# Patient Record
Sex: Female | Born: 1965
Health system: Southern US, Community
[De-identification: ages and names within clinical notes are randomized; demographics above are authoritative.]

## PROBLEM LIST (undated history)

## (undated) DIAGNOSIS — F32A Depression, unspecified: Secondary | ICD-10-CM

## (undated) DIAGNOSIS — IMO0001 Reserved for inherently not codable concepts without codable children: Secondary | ICD-10-CM

## (undated) DIAGNOSIS — T7840XA Allergy, unspecified, initial encounter: Secondary | ICD-10-CM

## (undated) DIAGNOSIS — F329 Major depressive disorder, single episode, unspecified: Secondary | ICD-10-CM

## (undated) HISTORY — DX: Depression, unspecified: F32.A

## (undated) HISTORY — PX: MOUTH SURGERY: SHX715

## (undated) HISTORY — DX: Allergy, unspecified, initial encounter: T78.40XA

## (undated) HISTORY — PX: BREAST BIOPSY: SHX20

## (undated) HISTORY — DX: Reserved for inherently not codable concepts without codable children: IMO0001

## (undated) HISTORY — DX: Major depressive disorder, single episode, unspecified: F32.9

---

## 2014-02-17 LAB — HM PAP SMEAR

## 2014-02-22 LAB — HM PAP SMEAR: HM Pap smear: NEGATIVE

## 2015-01-22 ENCOUNTER — Telehealth: Payer: Self-pay | Admitting: *Deleted

## 2015-01-22 NOTE — Telephone Encounter (Signed)
Patient unavailable at time of pre-visit call.   Patient has a voicemail that has not been set up, unable to leave message.

## 2015-01-23 ENCOUNTER — Ambulatory Visit: Payer: Self-pay | Admitting: Internal Medicine

## 2015-06-25 ENCOUNTER — Telehealth: Payer: Self-pay | Admitting: Behavioral Health

## 2015-06-25 NOTE — Telephone Encounter (Signed)
Unable to reach patient at time of Pre-Visit Call. Per recording, the patient's  number has either been changed, disconnected or no longer in service.

## 2015-06-26 ENCOUNTER — Encounter: Payer: Self-pay | Admitting: Internal Medicine

## 2015-06-26 ENCOUNTER — Ambulatory Visit (INDEPENDENT_AMBULATORY_CARE_PROVIDER_SITE_OTHER): Payer: BLUE CROSS/BLUE SHIELD | Admitting: Internal Medicine

## 2015-06-26 VITALS — BP 106/70 | HR 69 | Temp 97.9°F | Ht 71.0 in | Wt 211.2 lb

## 2015-06-26 DIAGNOSIS — F329 Major depressive disorder, single episode, unspecified: Secondary | ICD-10-CM | POA: Diagnosis not present

## 2015-06-26 DIAGNOSIS — F419 Anxiety disorder, unspecified: Secondary | ICD-10-CM | POA: Insufficient documentation

## 2015-06-26 DIAGNOSIS — F32A Depression, unspecified: Secondary | ICD-10-CM

## 2015-06-26 MED ORDER — SERTRALINE HCL 50 MG PO TABS: 50.0000 mg | ORAL_TABLET | Freq: Every day | ORAL | Status: DC

## 2015-06-26 NOTE — Progress Notes (Signed)
Pre visit review using our clinic review tool, if applicable. No additional management support is needed unless otherwise documented below in the visit note. 

## 2015-06-26 NOTE — Progress Notes (Signed)
   Subjective:    Patient ID: Rita Lambert, female    DOB: [birth year], 49 y.o.   MRN: [id number]  DOS:  06/26/2015 Type of visit - description :  New patient to establish, switching PCP Interval history: In general feeling well, long history of depression, on Zoloft for years, symptoms currently well controlled.    Review of Systems  denies chest pain or difficulty breathing No nausea, vomiting, diarrhea Started to increase her exercisea couple of months ago, has lot 10 pounds  Past Medical History  Diagnosis Date  . Allergy   . Depression   . Contraception     condoms    Past Surgical History  Procedure Laterality Date  . Vaginal delivery      2000, 2004, and 2010  . Mouth surgery      teeth removal, freniolectomy    Social History   Social History  . Marital Status: Married    Spouse Name: N/A  . Number of Children: 3  . Years of Education: N/A   Occupational History  . RN, health care communications , working on a PhD    Social History Main Topics  . Smoking status: Never Smoker   . Smokeless tobacco: Not on file  . Alcohol Use: 0.0 oz/week    0 Standard drinks or equivalent per week     Comment: Social  . Drug Use: No  . Sexual Activity: Not on file   Other Topics Concern  . Not on file   Social History Narrative     Family History  Problem Relation Age of Onset  . Breast cancer Other     aunt  . Colon cancer Neg Hx   . CAD Other     GF, uncle  . Diabetes Neg Hx   . Leukemia Mother        Medication List       This list is accurate as of: 06/26/15 11:59 PM.  Always use your most recent med list.               mometasone 50 MCG/ACT nasal spray  Commonly known as:  NASONEX  Place 2 sprays into the nose daily as needed. During Spring     sertraline 50 MG tablet  Commonly known as:  ZOLOFT  Take 1 tablet (50 mg total) by mouth daily.           Objective:   Physical Exam BP 106/70 mmHg  Pulse 69  Temp(Src) 97.9 F (36.6  C) (Oral)  Ht  (1.803 m)  Wt 211 lb 4 oz (95.822 kg)  BMI 29.48 kg/m2  SpO2 97%  LMP 06/19/2015 (Approximate)  General:   Well developed, well nourished . NAD.  HEENT:  Normocephalic . Face symmetric, atraumatic Lungs:  CTA B Normal respiratory effort, no intercostal retractions, no accessory muscle use. Heart: RRR,  no murmur.  No pretibial edema bilaterally  Skin: Not pale. Not jaundice Neurologic:  alert & oriented X3.  Speech normal, gait appropriate for age and unassisted Psych--  Cognition and judgment appear intact.  Cooperative with normal attention span and concentration.  Behavior appropriate. No anxious or depressed appearing.      Assessment & Plan:   depression: Refill Zoloft Doing well  Follow-up 6 months for a physical. Sees gyn

## 2015-10-11 ENCOUNTER — Other Ambulatory Visit: Payer: Self-pay

## 2015-10-11 MED ORDER — SERTRALINE HCL 50 MG PO TABS: 50.0000 mg | ORAL_TABLET | Freq: Every day | ORAL | Status: DC

## 2015-12-27 ENCOUNTER — Encounter: Payer: BLUE CROSS/BLUE SHIELD | Admitting: Internal Medicine

## 2016-04-01 ENCOUNTER — Telehealth: Payer: Self-pay | Admitting: *Deleted

## 2016-04-01 ENCOUNTER — Encounter: Payer: Self-pay | Admitting: *Deleted

## 2016-04-01 NOTE — Telephone Encounter (Signed)
Pre-Visit Call completed with patient and chart updated.   Pre-Visit Info documented in Specialty Comments under Snap[employer]ot.    

## 2016-04-02 ENCOUNTER — Ambulatory Visit (INDEPENDENT_AMBULATORY_CARE_PROVIDER_SITE_OTHER): Payer: 59 | Admitting: Internal Medicine

## 2016-04-02 ENCOUNTER — Encounter: Payer: Self-pay | Admitting: Internal Medicine

## 2016-04-02 VITALS — BP 108/74 | HR 89 | Temp 98.0°F | Ht 71.0 in | Wt 222.2 lb

## 2016-04-02 DIAGNOSIS — Z114 Encounter for screening for human immunodeficiency virus [HIV]: Secondary | ICD-10-CM

## 2016-04-02 DIAGNOSIS — Z0000 Encounter for general adult medical examination without abnormal findings: Secondary | ICD-10-CM | POA: Diagnosis not present

## 2016-04-02 DIAGNOSIS — Z23 Encounter for immunization: Secondary | ICD-10-CM | POA: Diagnosis not present

## 2016-04-02 DIAGNOSIS — Z09 Encounter for follow-up examination after completed treatment for conditions other than malignant neoplasm: Secondary | ICD-10-CM

## 2016-04-02 DIAGNOSIS — M25512 Pain in left shoulder: Secondary | ICD-10-CM

## 2016-04-02 NOTE — Progress Notes (Signed)
Pre visit review using our clinic review tool, if applicable. No additional management support is needed unless otherwise documented below in the visit note. 

## 2016-04-02 NOTE — Progress Notes (Signed)
Subjective:    Patient ID: Rita KirschnerDonna L Lambert, female    DOB: [birth year], 50 y.o.   MRN: [id number]  DOS:  04/02/2016 Type of visit - description : CPX Interval history: Depression well-controlled, has pain at the shoulder. See below    Review of Systems Constitutional: No fever. No chills. No unexplained wt changes. No unusual sweats  HEENT: No dental problems, no ear discharge, no facial swelling, no voice changes. No eye discharge, no eye  redness , no  intolerance to light   Respiratory: No wheezing , no  difficulty breathing. No cough , no mucus production  Cardiovascular: No CP, no leg swelling , no  Palpitations  GI: no nausea, no vomiting, no diarrhea , no  abdominal pain.  No blood in the stools. No dysphagia, no odynophagia    Endocrine: No polyphagia, no polyuria , no polydipsia  GU: No dysuria, gross hematuria, difficulty urinating. No urinary urgency, no frequency.  Musculoskeletal:  1 year history of left shoulder pain, at the deltoid area, only bothers her with certain movements, no neck pain.  Skin: No change in the color of the skin, palor , no  Rash  Allergic, immunologic: No environmental allergies , no  food allergies  Neurological: No dizziness no  syncope. No headaches. No diplopia, no slurred, no slurred speech, no motor deficits, no facial  Numbness  Hematological: No enlarged lymph nodes, no easy bruising , no unusual bleedings  Psychiatry: No suicidal ideas, no hallucinations, no beavior problems, no confusion.  No unusual/severe anxiety, no depression   Past Medical History  Diagnosis Date  . Allergy   . Depression   . Contraception     condoms    Past Surgical History  Procedure Laterality Date  . Vaginal delivery      2000, 2004, and 2010  . Mouth surgery      teeth removal, freniolectomy    Social History   Social History  . Marital Status: Married    Spouse Name: N/A  . Number of Children: 3  . Years of Education: N/A    Occupational History  . RN, health care communications ,  has a PhD    Social History Main Topics  . Smoking status: Never Smoker   . Smokeless tobacco: Not on file  . Alcohol Use: 0.0 oz/week    0 Standard drinks or equivalent per week     Comment: Social  . Drug Use: No  . Sexual Activity: Not on file   Other Topics Concern  . Not on file   Social History Narrative   Has a PhD   3 children Born 2000, 2004, 2010     Family History  Problem Relation Age of Onset  . Breast cancer Other     aunt  . Colon cancer Neg Hx   . CAD Other     GF, uncle  . Diabetes Neg Hx   . Leukemia Mother        Medication List       This list is accurate as of: 04/02/16 11:59 PM.  Always use your most recent med list.               ibuprofen 200 MG tablet  Commonly known as:  ADVIL,MOTRIN  Take 400 mg by mouth as needed for mild pain or moderate pain.     mometasone 50 MCG/ACT nasal spray  Commonly known as:  NASONEX  Place 2 sprays into the nose daily as needed.  During Spring     sertraline 50 MG tablet  Commonly known as:  ZOLOFT  Take 1 tablet (50 mg total) by mouth daily.           Objective:   Physical Exam BP 108/74 mmHg  Pulse 89  Temp(Src) 98 F (36.7 C) (Oral)  Ht  (1.803 m)  Wt 222 lb 4 oz (100.812 kg)  BMI 31.01 kg/m2  SpO2 98%  LMP 03/11/2016 (Approximate) General:   Well developed, well nourished . NAD.  Neck: No  thyromegaly  HEENT:  Normocephalic . Face symmetric, atraumatic Lungs:  CTA B Normal respiratory effort, no intercostal retractions, no accessory muscle use. Heart: RRR,  no murmur.  No pretibial edema bilaterally  Abdomen:  Not distended, soft, non-tender. No rebound or rigidity.   Skin: Exposed areas without rash. Not pale. Not jaundice MSK R shoulder wnl  L shoulder, no deformities, ROM decreased particularly w/ arm elevation Neurologic:  alert & oriented X3.  Speech normal, gait appropriate for age and  unassisted Strength symmetric and appropriate for age.  Psych: Cognition and judgment appear intact.  Cooperative with normal attention span and concentration.  Behavior appropriate. No anxious or depressed appearing.     Assessment & Plan:   Assessment Depression Allergies  Contraception: Condoms  Plan: Depression-- controlled cont zoloft Shoulder pain: refer to sports med  RTC 1 year

## 2016-04-02 NOTE — Patient Instructions (Signed)
   GO TO THE FRONT DESK Schedule your next appointment for a    Schedule labs to be done within a week, fasting

## 2016-04-02 NOTE — Assessment & Plan Note (Addendum)
Td  04-02-16 CCS: never had a cscope Female Care Dr Allena KatzPatel >> PAP: 2015, MMG ~ 2015 plans to call and set up appointments Diet-- doing well w/ calorie counting Encourage gradual exercise Labs: CMP CBC, FLP, TSH, HIV EKG- for baseline, NSR RTC  1 year

## 2016-04-03 ENCOUNTER — Other Ambulatory Visit (INDEPENDENT_AMBULATORY_CARE_PROVIDER_SITE_OTHER): Payer: 59

## 2016-04-03 DIAGNOSIS — Z0000 Encounter for general adult medical examination without abnormal findings: Secondary | ICD-10-CM | POA: Diagnosis not present

## 2016-04-03 DIAGNOSIS — Z114 Encounter for screening for human immunodeficiency virus [HIV]: Secondary | ICD-10-CM

## 2016-04-03 LAB — CBC WITH DIFFERENTIAL/PLATELET
Basophils Absolute: 0 10*3/uL (ref 0.0–0.1)
Basophils Relative: 0.4 % (ref 0.0–3.0)
EOS PCT: 3 % (ref 0.0–5.0)
Eosinophils Absolute: 0.2 10*3/uL (ref 0.0–0.7)
HEMATOCRIT: 37.5 % (ref 36.0–46.0)
HEMOGLOBIN: 12.2 g/dL (ref 12.0–15.0)
LYMPHS ABS: 1.5 10*3/uL (ref 0.7–4.0)
LYMPHS PCT: 18.9 % (ref 12.0–46.0)
MCHC: 32.5 g/dL (ref 30.0–36.0)
MCV: 77.5 fl — AB (ref 78.0–100.0)
MONOS PCT: 5.1 % (ref 3.0–12.0)
Monocytes Absolute: 0.4 10*3/uL (ref 0.1–1.0)
Neutro Abs: 5.7 10*3/uL (ref 1.4–7.7)
Neutrophils Relative %: 72.6 % (ref 43.0–77.0)
Platelets: 300 10*3/uL (ref 150.0–400.0)
RBC: 4.83 Mil/uL (ref 3.87–5.11)
RDW: 15.8 % — ABNORMAL HIGH (ref 11.5–15.5)
WBC: 7.9 10*3/uL (ref 4.0–10.5)

## 2016-04-03 LAB — COMPREHENSIVE METABOLIC PANEL
ALBUMIN: 4.3 g/dL (ref 3.5–5.2)
ALK PHOS: 63 U/L (ref 39–117)
ALT: 8 U/L (ref 0–35)
AST: 13 U/L (ref 0–37)
BILIRUBIN TOTAL: 0.5 mg/dL (ref 0.2–1.2)
BUN: 15 mg/dL (ref 6–23)
CALCIUM: 9.1 mg/dL (ref 8.4–10.5)
CO2: 26 mEq/L (ref 19–32)
CREATININE: 0.96 mg/dL (ref 0.40–1.20)
Chloride: 107 mEq/L (ref 96–112)
GFR: 65.75 mL/min (ref >60.00)
Glucose, Bld: 102 mg/dL — ABNORMAL HIGH (ref 70–99)
Potassium: 3.8 mEq/L (ref 3.5–5.1)
Sodium: 139 mEq/L (ref 135–145)
TOTAL PROTEIN: 6.7 g/dL (ref 6.0–8.3)

## 2016-04-03 LAB — HIV ANTIBODY (ROUTINE TESTING W REFLEX): HIV 1&2 Ab, 4th Generation: NONREACTIVE

## 2016-04-03 LAB — LIPID PANEL
CHOLESTEROL: 180 mg/dL (ref 0–200)
HDL: 48.9 mg/dL (ref >39.00)
LDL Cholesterol: 122 mg/dL — ABNORMAL HIGH (ref 0–99)
NONHDL: 130.98
TRIGLYCERIDES: 46 mg/dL (ref 0.0–149.0)
Total CHOL/HDL Ratio: 4
VLDL: 9.2 mg/dL (ref 0.0–40.0)

## 2016-04-03 LAB — TSH: TSH: 1.58 u[IU]/mL (ref 0.35–4.50)

## 2016-04-04 ENCOUNTER — Ambulatory Visit: Payer: 59 | Admitting: Family Medicine

## 2016-04-09 ENCOUNTER — Ambulatory Visit (INDEPENDENT_AMBULATORY_CARE_PROVIDER_SITE_OTHER): Payer: 59 | Admitting: Family Medicine

## 2016-04-09 ENCOUNTER — Encounter: Payer: Self-pay | Admitting: Family Medicine

## 2016-04-09 VITALS — BP 110/71 | HR 83 | Ht 71.0 in | Wt 220.0 lb

## 2016-04-09 DIAGNOSIS — Z09 Encounter for follow-up examination after completed treatment for conditions other than malignant neoplasm: Secondary | ICD-10-CM | POA: Insufficient documentation

## 2016-04-09 DIAGNOSIS — M25512 Pain in left shoulder: Secondary | ICD-10-CM

## 2016-04-09 MED ORDER — MELOXICAM 15 MG PO TABS: 15.0000 mg | ORAL_TABLET | Freq: Every day | ORAL | Status: DC

## 2016-04-09 NOTE — Patient Instructions (Signed)
You have rotator cuff impingement after the initial rotator cuff strain Try to avoid painful activities (overhead activities, lifting with extended arm) as much as possible. Meloxicam 15mg  daily with food for pain and inflammation. Can take tylenol in addition to this. Subacromial injection may be beneficial to help with pain and to decrease inflammation. Start physical therapy with transition to home exercise program. Do home exercise program with theraband and scapular stabilization exercises daily - these are very important for long term relief even if an injection was given.  3 sets of 10 once a day.  Add the external rotation stretching I showed you too. If not improving at follow-up we will consider imaging, injection, and/or nitro patches. Follow up with me in 6 weeks.

## 2016-04-10 DIAGNOSIS — M25512 Pain in left shoulder: Secondary | ICD-10-CM | POA: Insufficient documentation

## 2016-04-10 NOTE — Assessment & Plan Note (Signed)
consistent with overuse strain of supraspinatus that has become impingement since then.  Start with meloxicam, physical therapy and home exercises.  Consider imaging, subacromial injection, nitro patches if not improving.  F/u in 6 weeks.

## 2016-04-10 NOTE — Progress Notes (Signed)
PCP and consultation requested by: Willow OraJose Paz, MD  Subjective:   HPI: Patient is a 50 y.o. female here for left shoulder pain.  Patient reports she recalls hanging clothes a year ago in the closet and started to get worsening lateral left shoulder pain. Has continued to have problems especially reaching out to side, lying on the left side since then. Pain is 0/10 at rest, up to 8/10 and sharp. No prior issues with this shoulder. Difficulty with externally rotating. No skin changes, numbness.  Past Medical History  Diagnosis Date  . Allergy   . Depression   . Contraception     condoms    Current Outpatient Prescriptions on File Prior to Visit  Medication Sig Dispense Refill  . ibuprofen (ADVIL,MOTRIN) 200 MG tablet Take 400 mg by mouth as needed for mild pain or moderate pain.    . mometasone (NASONEX) 50 MCG/ACT nasal spray Place 2 sprays into the nose daily as needed. During Spring    . sertraline (ZOLOFT) 50 MG tablet Take 1 tablet (50 mg total) by mouth daily. 90 tablet 1   No current facility-administered medications on file prior to visit.    Past Surgical History  Procedure Laterality Date  . Vaginal delivery      2000, 2004, and 2010  . Mouth surgery      teeth removal, freniolectomy    No Known Allergies  Social History   Social History  . Marital Status: Married    Spouse Name: N/A  . Number of Children: 3  . Years of Education: N/A   Occupational History  . RN, health care communications ,  has a PhD    Social History Main Topics  . Smoking status: Never Smoker   . Smokeless tobacco: Not on file  . Alcohol Use: 0.0 oz/week    0 Standard drinks or equivalent per week     Comment: Social  . Drug Use: No  . Sexual Activity: Not on file   Other Topics Concern  . Not on file   Social History Narrative   Has a PhD   3 children Born 2000, 2004, 2010    Family History  Problem Relation Age of Onset  . Breast cancer Other     aunt  . Colon  cancer Neg Hx   . CAD Other     GF, uncle  . Diabetes Neg Hx   . Leukemia Mother     BP 110/71 mmHg  Pulse 83  Ht 5\' 11"  (1.803 m)  Wt 220 lb (99.791 kg)  BMI 30.70 kg/m2  LMP 03/11/2016 (Approximate)  Review of Systems: See HPI above.    Objective:  Physical Exam:  Gen: NAD, comfortable in exam room  Left shoulder: No swelling, ecchymoses.  No gross deformity. No TTP. Lacks 15 degrees external rotation.  Otherwise FROM with painful arc. Negative Hawkins, Neers. Negative Yergasons. Strength 5/5 with empty can and resisted internal/external rotation.  Mild pain empty can Negative apprehension. 1+ sulcus but same on right side. NV intact distally.  Right shoulder: FROM without pain.    Assessment & Plan:  1. Left shoulder pain - consistent with overuse strain of supraspinatus that has become impingement since then.  Start with meloxicam, physical therapy and home exercises.  Consider imaging, subacromial injection, nitro patches if not improving.  F/u in 6 weeks.

## 2016-04-22 ENCOUNTER — Ambulatory Visit: Payer: 59 | Attending: Family Medicine | Admitting: Physical Therapy

## 2016-04-22 DIAGNOSIS — R293 Abnormal posture: Secondary | ICD-10-CM | POA: Insufficient documentation

## 2016-04-22 DIAGNOSIS — M25512 Pain in left shoulder: Secondary | ICD-10-CM | POA: Diagnosis present

## 2016-04-22 DIAGNOSIS — M25612 Stiffness of left shoulder, not elsewhere classified: Secondary | ICD-10-CM

## 2016-04-22 NOTE — Therapy (Signed)
[facility] [address]  [address] [place], [place], [address] Phone: [phone]   Fax:  [phone]  Physical Therapy Evaluation  Patient Details  Name: Rita Lambert MRN: [id number] Date of Birth: 1966-09-15 Referring Provider: Dr. Norton Blizzard  Encounter Date: 04/22/2016      PT End of Session - 04/22/16 1530    Visit Number 1   Number of Visits 12   Date for PT Re-Evaluation 06/03/16   PT Start Time 1345   PT Stop Time 1425   PT Time Calculation (min) 40 min   Activity Tolerance Patient tolerated treatment well   Behavior During Therapy [facility] for tasks assessed/performed      Past Medical History  Diagnosis Date  . Allergy   . Depression   . Contraception     condoms    Past Surgical History  Procedure Laterality Date  . Vaginal delivery      2000, 2004, and 2010  . Mouth surgery      teeth removal, freniolectomy    There were no vitals filed for this visit.       Subjective Assessment - 04/22/16 1348    Subjective Pt is a 50 y/o female that presents to OPPT for L shoulder pain.  Pt reports symptoms began 9-10 months ago with repetitive lifting and putting clothes away overhead.  Pt reports she has to modify activities due to pain and limited motion.     Pertinent History depression   Limitations Lifting;House hold activities;Other (comment)  sudden movements   Patient Stated Goals improve motion, decreased pain with reaching   Currently in Pain? Yes   Pain Score 0-No pain  up to 8/10   Pain Location Shoulder   Pain Orientation Left   Pain Descriptors / Indicators Stabbing   Pain Type Chronic pain   Pain Onset More than a month ago   Pain Frequency Intermittent   Aggravating Factors  reaching, sudden movements; overhead activity   Pain Relieving Factors avoiding motions            [facility] PT Assessment - 04/22/16 1353    Assessment   Medical Diagnosis L shoulder pain/rotator cuff  impingement   Referring Provider Dr. Norton Blizzard   Onset Date/Surgical Date --  9-10 months ago   Hand Dominance Right   Prior Therapy none   Precautions   Precautions None   Restrictions   Weight Bearing Restrictions No   Balance Screen   Has the patient fallen in the past 6 months Yes   How many times? 1   Has the patient had a decrease in activity level because of a fear of falling?  No   Is the patient reluctant to leave their home because of a fear of falling?  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    Leisure has 3 children; travel, shopping, cross stitch   Observation/Other Assessments   Focus on Therapeutic Outcomes (FOTO)  56 (44% limited; predicted 31% limited)   Posture/Postural Control   Posture/Postural Control Postural limitations   Postural Limitations Rounded Shoulders;Forward head;Increased thoracic kyphosis   AROM   AROM Assessment Site Shoulder   Right Shoulder Flexion 154 Degrees   Right Shoulder ABduction 180 Degrees   Right Shoulder Internal Rotation 98 Degrees   Right Shoulder External Rotation 80 Degrees   Left Shoulder  Flexion 135 Degrees   Left Shoulder ABduction 100 Degrees   Left Shoulder Internal Rotation 90 Degrees   Left Shoulder External Rotation 45 Degrees   PROM   PROM Assessment Site Shoulder   Left Shoulder Flexion 140 Degrees   Left Shoulder ABduction 109 Degrees   Left Shoulder External Rotation 55 Degrees   Strength   Strength Assessment Site Shoulder   Left Shoulder Flexion 3-/5   Left Shoulder ABduction 3-/5   Left Shoulder External Rotation 3-/5   Palpation   Palpation comment tenderness along supraspinatus tendon;mod pain with A/P and inf mobs   Special Tests    Special Tests Rotator Cuff Impingement   Rotator Cuff Impingment tests Leanord AsalHawkins- Kennedy test   Hawkins-Kennedy test   Findings Negative   Side Left                    [facility] Adult PT Treatment/Exercise - 04/22/16 1353    Exercises   Exercises Shoulder   Shoulder Exercises: Supine   External Rotation AAROM;10 reps;Left   External Rotation Limitations with cane   Flexion AAROM;10 reps   Flexion Limitations with cane   Shoulder Exercises: Seated   Retraction Both;10 reps                PT Education - 04/22/16 1529    Education provided Yes   Education Details HEP, clinical findings, POC, goals of care   Person(s) Educated Patient   Methods Explanation;Demonstration;Handout   Comprehension Verbalized understanding;Returned demonstration             PT Long Term Goals - 04/22/16 1536    PT LONG TERM GOAL #1   Title independent with HEP (06/03/16)   Time 6   Period Weeks   Status New   PT LONG TERM GOAL #2   Title verbalize understanding of posture to decrease risk of reinjury (06/03/16)   Time 6   Period Weeks   Status New   PT LONG TERM GOAL #3   Title improve L shoulder ROM to WNL for improved ability to perform activities without modification (06/03/16)   Time 6   Period Weeks   Status New   PT LONG TERM GOAL #4   Title improve L shoulder strength to at least 4/5 for improved function    Time 6   Period Weeks   Status New               Plan - 04/22/16 1531    Clinical Impression Statement Pt is a 50 y/o female who presents to OPPT for low complexity evaluation of L shoulder pain.  Pt demonstrates ROM limitations, decreased strength, and postural dysfunction with pain and decreased functional mobility.  Pt will benefit from PT to maximize function and decrease pain.   Rehab Potential Good   PT Frequency 2x / week   PT Duration 6 weeks   PT Treatment/Interventions ADLs/Self Care Home Management;Cryotherapy;Electrical Stimulation;Iontophoresis 4mg /ml Dexamethasone;Moist Heat;Ultrasound;Patient/family education;Therapeutic exercise;Therapeutic activities;Functional mobility  training;Manual techniques;Passive range of motion;Dry needling;Taping;Vasopneumatic Device   PT Next Visit Plan review HEP; postural education; manual for ROM and gentle strengthening exercises   Consulted and Agree with Plan of Care Patient      Patient will benefit from skilled therapeutic intervention in order to improve the following deficits and impairments:  Postural dysfunction, Decreased strength, Pain, Hypermobility, Impaired UE functional use, Decreased range of motion  Visit Diagnosis: Pain in left shoulder - Plan: PT plan of care cert/re-cert  Stiffness  of left shoulder, not elsewhere classified - Plan: PT plan of care cert/re-cert  Abnormal posture - Plan: PT plan of care cert/re-cert     Problem List Patient Active Problem List   Diagnosis Date Noted  . Left shoulder pain 04/10/2016  . PCP NOTES >>>>>>>>>>>>>>>> 04/09/2016  . Annual physical exam 04/02/2016  . Depression 06/26/2015   Clarita Crane, PT, DPT 04/22/2016 3:40 PM  [facility] [address]  [address] [place], [place], [address] Phone: [phone]   Fax:  [phone]  Name: Rita Lambert MRN: [id number] Date of Birth: [birth year]

## 2016-04-22 NOTE — Patient Instructions (Signed)
SHOULDER: Flexion - Supine (Cane)    Hold cane in both hands. Raise arms up overhead. Do not allow back to arch. Hold _3-5__ seconds. _10__ reps per set, __1-2_ sets per day.   Copyright  VHI. All rights reserved.    SHOULDER: External Rotation - Supine (Cane)    Hold cane with both hands. Rotate arm away from body. Keep elbow on floor and next to body. _10__ reps per set, _1-2__ sets per day. Add towel to keep elbow at side.  Copyright  VHI. All rights reserved.   SCAPULA: Retraction    Hold cane with both hands. Pinch shoulder blades together. Do not shrug shoulders. Hold _3-5__ seconds.  _10__ reps per set, _1-2__ sets per day.  Copyright  VHI. All rights reserved.

## 2016-04-24 ENCOUNTER — Ambulatory Visit: Payer: 59

## 2016-04-24 DIAGNOSIS — M25512 Pain in left shoulder: Secondary | ICD-10-CM

## 2016-04-24 DIAGNOSIS — M25612 Stiffness of left shoulder, not elsewhere classified: Secondary | ICD-10-CM

## 2016-04-24 DIAGNOSIS — R293 Abnormal posture: Secondary | ICD-10-CM

## 2016-04-24 NOTE — Therapy (Signed)
[facility] [address]  [address] [place], [place], [address] Phone: [phone]   Fax:  [phone]  Physical Therapy Treatment  Patient Details  Name: KEMYA SHED MRN: [id number] Date of Birth: July 09, 1966 Referring Provider: Dr. Norton Blizzard  Encounter Date: 04/24/2016      PT End of Session - 04/24/16 1718    Visit Number 2   Number of Visits 12   Date for PT Re-Evaluation 06/03/16   PT Start Time 1624   PT Stop Time 1705   PT Time Calculation (min) 41 min   Activity Tolerance Patient tolerated treatment well   Behavior During Therapy [facility] for tasks assessed/performed      Past Medical History  Diagnosis Date  . Allergy   . Depression   . Contraception     condoms    Past Surgical History  Procedure Laterality Date  . Vaginal delivery      2000, 2004, and 2010  . Mouth surgery      teeth removal, freniolectomy    There were no vitals filed for this visit.      Subjective Assessment - 04/24/16 1733    Subjective Pt. reports she has performed HEP yesterday and is pain free currently.     Patient Stated Goals improve motion, decreased pain with reaching   Currently in Pain? No/denies   Pain Score 0-No pain   Multiple Pain Sites No     Today's treatment:  Therex: ER/IR with yellow TB in door x 15 reps: with towel squeeze  Standing scapular retraction with green TB x 15 reps  Supine L shoulder protraction 2 x 15 reps; 2nd set with 2# dumbbell  Manual: L UT TPR x 3 min; along medial scapular border  L UT STM x 2 min  HEP review: L shoulder AAROM flexion with wand x 10 reps  L shoulder AAROM ER with wand x 10 reps  Seated scapular retraction x 10 reps   Education:  Seated postural education L shoulder impingement visual on computer and with model with explanation of importance of rotator cuff strength and functional role of these muscles        PT Education - 04/24/16 1717    Education provided Yes   Education Details IR/ER with yellow band, retraction with green TB    Person(s) Educated Patient   Methods Explanation;Handout;Verbal cues;Tactile cues   Comprehension Verbalized understanding;Returned demonstration;Need further instruction             PT Long Term Goals - 04/24/16 1734    PT LONG TERM GOAL #1   Title independent with HEP (06/03/16)   Time 6   Period Weeks   Status On-going   PT LONG TERM GOAL #2   Title verbalize understanding of posture to decrease risk of reinjury (06/03/16)   Time 6   Period Weeks   Status On-going   PT LONG TERM GOAL #3   Title improve L shoulder ROM to WNL for improved ability to perform activities without modification (06/03/16)   Time 6   Period Weeks   Status On-going   PT LONG TERM GOAL #4   Title improve L shoulder strength to at least 4/5 for improved function    Time 6   Period Weeks   Status On-going               Plan - 04/24/16 1734    Clinical Impression Statement Pt. reports she has performed  HEP yesterday and is pain free currently.  Pt. tolerated all L shoulder AROM and conservative strengthening today however required occasional VC's to avoid painful arc; scapular strengthening activity with TB resistance introduced today with pt. pain free.  IR/ER with yellow TB and scapular retraction with green TB added to HEP today; pt. would benefit from further review of these in coming treatments.     PT Treatment/Interventions ADLs/Self Care Home Management;Cryotherapy;Electrical Stimulation;Iontophoresis 4mg /ml Dexamethasone;Moist Heat;Ultrasound;Patient/family education;Therapeutic exercise;Therapeutic activities;Functional mobility training;Manual techniques;Passive range of motion;Dry needling;Taping;Vasopneumatic Device   PT Next Visit Plan Review HEP activities added last treatment; postural education; manual for ROM and gentle strengthening exercises      Patient will benefit from skilled  therapeutic intervention in order to improve the following deficits and impairments:  Postural dysfunction, Decreased strength, Pain, Hypermobility, Impaired UE functional use, Decreased range of motion  Visit Diagnosis: Pain in left shoulder  Stiffness of left shoulder, not elsewhere classified  Abnormal posture     Problem List Patient Active Problem List   Diagnosis Date Noted  . Left shoulder pain 04/10/2016  . PCP NOTES >>>>>>>>>>>>>>>> 04/09/2016  . Annual physical exam 04/02/2016  . Depression 06/26/2015    Kermit BaloMicah Deona Novitski, PTA 04/25/2016, 11:30 AM  [facility] [address]  [address] [place], [place], [address] Phone: [phone]   Fax:  [phone]  Name: Elmon KirschnerDonna L Fitzgibbons MRN: [id number] Date of Birth: 1966-10-21

## 2016-05-01 ENCOUNTER — Ambulatory Visit: Payer: 59

## 2016-05-01 DIAGNOSIS — M25512 Pain in left shoulder: Secondary | ICD-10-CM | POA: Diagnosis not present

## 2016-05-01 DIAGNOSIS — M25612 Stiffness of left shoulder, not elsewhere classified: Secondary | ICD-10-CM

## 2016-05-01 DIAGNOSIS — R293 Abnormal posture: Secondary | ICD-10-CM

## 2016-05-01 NOTE — Therapy (Signed)
[facility] [address]  [address] [place], [place], [address] Phone: [phone]   Fax:  [phone]  Physical Therapy Treatment  Patient Details  Name: Rita KirschnerDonna L Lambert MRN: [id number] Date of Birth: Apr 07, 1966 Referring Provider: Dr. Norton BlizzardShane Hudnall  Encounter Date: 05/01/2016      PT End of Session - 05/01/16 1729    Visit Number 3   Number of Visits 12   Date for PT Re-Evaluation 06/03/16   PT Start Time 1537   PT Stop Time 1618   PT Time Calculation (min) 41 min   Activity Tolerance Patient tolerated treatment well   Behavior [facility] for tasks assessed/performed      Past Medical History  Diagnosis Date  . Allergy   . Depression   . Contraception     condoms    Past Surgical History  Procedure Laterality Date  . Vaginal delivery      2000, 2004, and 2010  . Mouth surgery      teeth removal, freniolectomy    There were no vitals filed for this visit.      Subjective Assessment - 05/01/16 1541    Subjective Pt. reports she is pain free today and has been performing the HEP "some days".     Patient Stated Goals improve motion, decreased pain with reaching   Currently in Pain? No/denies   Pain Score 0-No pain   Multiple Pain Sites No       Today's treatment:  Therex:  UBE: 1.5 level, 2 min forward/2 back Supine L shoulder abduction stretch with wand x 15 reps ER/IR with yellow TB in door x 15 reps: with towel squeeze  Supine L shoulder protraction with 3# x 15 reps Supine B shoulder with wand and 3# cuff x 10 reps Standing scapular retraction with blue TB x 15 reps  Standng scapular retraction with extension with blue TB x 10 reps  BATCA low row machine 25# 2 x 15 reps  HEP review (verbally reviewed these): L shoulder AAROM flexion with wand  L shoulder AAROM ER with wand  Seated scapular retraction    Education:  Standing postural education          PT Long Term  Goals - 04/24/16 1734    PT LONG TERM GOAL #1   Title independent with HEP (06/03/16)   Time 6   Period Weeks   Status On-going   PT LONG TERM GOAL #2   Title verbalize understanding of posture to decrease risk of reinjury (06/03/16)   Time 6   Period Weeks   Status On-going   PT LONG TERM GOAL #3   Title improve L shoulder ROM to WNL for improved ability to perform activities without modification (06/03/16)   Time 6   Period Weeks   Status On-going   PT LONG TERM GOAL #4   Title improve L shoulder strength to at least 4/5 for improved function    Time 6   Period Weeks   Status On-going               Plan - 05/01/16 1729    Clinical Impression Statement Pt. reports she is pain free today and has been performing the HEP "some days".  Pt. tolerated continued yellow TB resisted L shoulder ER/IR, and advancement of scapular strengthening activity well today; proper standing posture reviewed with pt. today with frequent VC's throughout scapular retraction activities.  Pt. would benefit from further  posture education and scapular strengthening with advancement of L shoulder strengthening activity per pt. tolerance.  Pt. pain free following treatment today.     PT Treatment/Interventions ADLs/Self Care Home Management;Cryotherapy;Electrical Stimulation;Iontophoresis 4mg /ml Dexamethasone;Moist Heat;Ultrasound;Patient/family education;Therapeutic exercise;Therapeutic activities;Functional mobility training;Manual techniques;Passive range of motion;Dry needling;Taping;Vasopneumatic Device   PT Next Visit Plan Postural education; manual for ROM and gentle strengthening exercises      Patient will benefit from skilled therapeutic intervention in order to improve the following deficits and impairments:  Postural dysfunction, Decreased strength, Pain, Hypermobility, Impaired UE functional use, Decreased range of motion  Visit Diagnosis: Pain in left shoulder  Stiffness of left shoulder, not  elsewhere classified  Abnormal posture     Problem List Patient Active Problem List   Diagnosis Date Noted  . Left shoulder pain 04/10/2016  . PCP NOTES >>>>>>>>>>>>>>>> 04/09/2016  . Annual physical exam 04/02/2016  . Depression 06/26/2015    Kermit BaloMicah Shritha Bresee, PTA 05/01/2016, 5:34 PM  [facility] [address]  [address] [place], [place], [address] Phone: [phone]   Fax:  [phone]  Name: Rita KirschnerDonna L Lambert MRN: [id number] Date of Birth: [birth year]

## 2016-05-09 ENCOUNTER — Ambulatory Visit: Payer: 59

## 2016-05-09 DIAGNOSIS — M25612 Stiffness of left shoulder, not elsewhere classified: Secondary | ICD-10-CM

## 2016-05-09 DIAGNOSIS — M25512 Pain in left shoulder: Secondary | ICD-10-CM | POA: Diagnosis not present

## 2016-05-09 DIAGNOSIS — R293 Abnormal posture: Secondary | ICD-10-CM

## 2016-05-09 NOTE — Therapy (Signed)
[facility] [address]  [place], [place], [address] Phone: [phone]   Fax:  [phone]  Physical Therapy Treatment  Patient Details  Name: Rita Lambert MRN: [id number] Date of Birth: 1966-06-09 Referring Provider: Dr. Karlton Lemon  Encounter Date: 05/09/2016      PT End of Session - 05/09/16 0857    Visit Number 4   Number of Visits 12   Date for PT Re-Evaluation 06/03/16   PT Start Time 2637  Pt. arrived late to therapy thus started treatment late.     PT Stop Time 0932   PT Time Calculation (min) 40 min   Activity Tolerance Patient tolerated treatment well   Behavior During Therapy [facility] for tasks assessed/performed      Past Medical History  Diagnosis Date  . Allergy   . Depression   . Contraception     condoms    Past Surgical History  Procedure Laterality Date  . Vaginal delivery      2000, 2004, and 2010  . Mouth surgery      teeth removal, freniolectomy    There were no vitals filed for this visit.      Subjective Assessment - 05/09/16 0936    Subjective Pt. to therapy for first time in eight days.  Pt. reports she is pain free today, has been able to perform the HEP "some days" since last treatment, and, "hasn't had any flare ups", since last treatment.    Patient Stated Goals improve motion, decreased pain with reaching            [facility] PT Assessment - 05/09/16 0001    AROM   AROM Assessment Site Shoulder   Left Shoulder Flexion 144 Degrees   Left Shoulder ABduction 104 Degrees   Left Shoulder Internal Rotation 82 Degrees   Left Shoulder External Rotation 50 Degrees   Strength   Strength Assessment Site Shoulder   Left Shoulder Flexion 4/5   Left Shoulder ABduction 4/5   Left Shoulder External Rotation 4/5       Today's treatment:  Therex:  UBE: 1.5 level, 2 min forward/2 back Supine AAROM shoulder flexion  ER/IR with red TB in door x 15 reps: with towel  squeeze  Standing scapular retraction with blue TB x 15 reps  Standng scapular retraction with extension with green TB x 15 reps   HEP review (verbally reviewed these): L shoulder AAROM flexion with wand  L shoulder AAROM ER with wand  Seated scapular retraction    Manual: L shoulder PROM all directions  Trigger point release x 1 min; pt. with no sign of trigger point formation along L scapular border or L UT  MMT assessment  Goal assessment   ROM assessment        PT Long Term Goals - 05/09/16 1222    PT LONG TERM GOAL #1   Title independent with HEP (06/03/16)   Time 6   Period Weeks   Status Achieved   PT LONG TERM GOAL #2   Title verbalize understanding of posture to decrease risk of reinjury (06/03/16)   Time 6   Period Weeks   Status On-going   PT LONG TERM GOAL #3   Title improve L shoulder ROM to WNL for improved ability to perform activities without modification (06/03/16)   Time 6   Period Weeks   Status On-going   PT LONG TERM GOAL #4   Title improve L shoulder  strength to at least 4/5 for improved function    Time 6   Period Weeks   Status Partially Met  05/09/16: Pt. able to demo 4/5 L shoulder flexion, abd, and ER strength however with 2/10 pain with these holds.                 Plan - 05/09/16 0858    Clinical Impression Statement Pt. to therapy for first time in eight days.  Pt. reports she is pain free today, has been able to perform the HEP "some days" since last treatment, and, "hasn't had any flare ups", since last treatment. Pt. tolerated all scapular strengthening activity and advancement of L shoulder ER/IR resistance with red TB well without pain.  UT trigger point screening performed today with pt. showing no sign of trigger point formation along L scapular border or L UT.     PT Treatment/Interventions ADLs/Self Care Home Management;Cryotherapy;Electrical Stimulation;Iontophoresis 38m/ml Dexamethasone;Moist  Heat;Ultrasound;Patient/family education;Therapeutic exercise;Therapeutic activities;Functional mobility training;Manual techniques;Passive range of motion;Dry needling;Taping;Vasopneumatic Device   PT Next Visit Plan Postural education; manual for ROM and gentle strengthening exercises      Patient will benefit from skilled therapeutic intervention in order to improve the following deficits and impairments:  Postural dysfunction, Decreased strength, Pain, Hypermobility, Impaired UE functional use, Decreased range of motion  Visit Diagnosis: Pain in left shoulder  Stiffness of left shoulder, not elsewhere classified  Abnormal posture     Problem List Patient Active Problem List   Diagnosis Date Noted  . Left shoulder pain 04/10/2016  . PCP NOTES >>>>>>>>>>>>>>>> 04/09/2016  . Annual physical exam 04/02/2016  . Depression 06/26/2015    MBess Harvest PTA 05/09/2016, 12:26 PM  [facility] [place] [address]Phone: [phone]  Fax:  [phone] Name: Rita ABBASIMRN: [id number] of Birth: [birth year]

## 2016-05-14 ENCOUNTER — Ambulatory Visit: Payer: 59 | Attending: Family Medicine

## 2016-05-14 DIAGNOSIS — R293 Abnormal posture: Secondary | ICD-10-CM

## 2016-05-14 DIAGNOSIS — M25512 Pain in left shoulder: Secondary | ICD-10-CM | POA: Diagnosis present

## 2016-05-14 DIAGNOSIS — M25612 Stiffness of left shoulder, not elsewhere classified: Secondary | ICD-10-CM | POA: Diagnosis present

## 2016-05-14 NOTE — Therapy (Signed)
[facility] [address]  [address] [place], [place], [address] Phone: [phone]   Fax:  [phone]  Physical Therapy Treatment  Patient Details  Name: Rita Lambert MRN: [id number] Date of Birth: 1966/08/13 Referring Provider: Dr. Norton Blizzard  Encounter Date: 05/14/2016      PT End of Session - 05/14/16 1718    Visit Number 5   Number of Visits 12   Date for PT Re-Evaluation 06/03/16   PT Start Time 1709   PT Stop Time 1750   PT Time Calculation (min) 41 min   Activity Tolerance Patient tolerated treatment well   Behavior During Therapy [facility] for tasks assessed/performed      Past Medical History  Diagnosis Date  . Allergy   . Depression   . Contraception     condoms    Past Surgical History  Procedure Laterality Date  . Vaginal delivery      2000, 2004, and 2010  . Mouth surgery      teeth removal, freniolectomy    There were no vitals filed for this visit.      Subjective Assessment - 05/14/16 1714    Subjective Pt. reports she has felt improvement in the L shoulder's function; she is now able to manage her bra and turn over in bed without pain.     Patient Stated Goals improve motion, decreased pain with reaching   Currently in Pain? No/denies   Pain Score 0-No pain   Multiple Pain Sites No      Today's treatment:  Therex:   UBE: 2.5 level, 2 min forward/2 back Supine AAROM shoulder flexion with wand x 10 reps ER/IR with red TB in door x 15 reps: with towel squeeze    Standing scapular retraction with black TB x 15 reps   Standng scapular retraction with extension with blueTB x 15 reps   TRX row back x 15 reps Doorway stretch x 30 sec; terminated due to pt. L shoulder pain BATCA pulldown 25# x 15 reps  BATCA low row 25# x 15 reps  Manual: Trigger point release to B UT with red med ball (1000Gr) x 1 min          PT Long Term Goals - 05/09/16 1222    PT LONG TERM GOAL #1   Title independent with HEP (06/03/16)   Time 6   Period Weeks   Status Achieved   PT LONG TERM GOAL #2   Title verbalize understanding of posture to decrease risk of reinjury (06/03/16)   Time 6   Period Weeks   Status On-going   PT LONG TERM GOAL #3   Title improve L shoulder ROM to WNL for improved ability to perform activities without modification (06/03/16)   Time 6   Period Weeks   Status On-going   PT LONG TERM GOAL #4   Title improve L shoulder strength to at least 4/5 for improved function    Time 6   Period Weeks   Status Partially Met  05/09/16: Pt. able to demo 4/5 L shoulder flexion, abd, and ER strength however with 2/10 pain with these holds.                 Plan - 05/14/16 1718    Clinical Impression Statement Pt. reports she has felt improvement in the L shoulder's function; she is now able to manage her bra and turn over in bed without pain.  Pt. with  improved tolerance for L shoulder and scapular strengthening today able to tolerate increased repetitions and resistance  without L shoulder pain.  Pt. continues with forward rounded shoulders and forward head resting posture and would benefit from further skilled PT to improve posture.       PT Treatment/Interventions ADLs/Self Care Home Management;Cryotherapy;Electrical Stimulation;Iontophoresis 50m/ml Dexamethasone;Moist Heat;Ultrasound;Patient/family education;Therapeutic exercise;Therapeutic activities;Functional mobility training;Manual techniques;Passive range of motion;Dry needling;Taping;Vasopneumatic Device   PT Next Visit Plan Postural education; manual for ROM and gentle strengthening exercises      Patient will benefit from skilled therapeutic intervention in order to improve the following deficits and impairments:  Postural dysfunction, Decreased strength, Pain, Hypermobility, Impaired UE functional use, Decreased range of motion  Visit Diagnosis: Pain in left shoulder  Stiffness of left shoulder, not  elsewhere classified  Abnormal posture     Problem List Patient Active Problem List   Diagnosis Date Noted  . Left shoulder pain 04/10/2016  . PCP NOTES >>>>>>>>>>>>>>>> 04/09/2016  . Annual physical exam 04/02/2016  . Depression 06/26/2015    MBess Harvest PTA 05/15/2016, 3:19 PM  [facility] [place] [address]Phone: [phone]  Fax:  [phone] Name: Rita ROBERSMRN: [id number] of Birth: 925-Jan-1967

## 2016-05-16 ENCOUNTER — Ambulatory Visit: Payer: 59 | Admitting: Physical Therapy

## 2016-05-16 DIAGNOSIS — M25612 Stiffness of left shoulder, not elsewhere classified: Secondary | ICD-10-CM

## 2016-05-16 DIAGNOSIS — M25512 Pain in left shoulder: Secondary | ICD-10-CM | POA: Diagnosis not present

## 2016-05-16 DIAGNOSIS — R293 Abnormal posture: Secondary | ICD-10-CM

## 2016-05-16 NOTE — Patient Instructions (Signed)

## 2016-05-16 NOTE — Therapy (Signed)
[facility] [address]  [place], [place], [address] Phone: [phone]   Fax:  [phone]  Physical Therapy Treatment  Patient Details  Name: Rita Lambert MRN: [id number] Date of Birth: 1966/03/16 Referring Provider: Dr. Karlton Lemon  Encounter Date: 05/16/2016      PT End of Session - 05/16/16 0829    Visit Number 6   Number of Visits 12   Date for PT Re-Evaluation 06/03/16   PT Start Time 0751   PT Stop Time 0829   PT Time Calculation (min) 38 min   Activity Tolerance Patient tolerated treatment well   Behavior During Therapy [facility] for tasks assessed/performed      Past Medical History  Diagnosis Date  . Allergy   . Depression   . Contraception     condoms    Past Surgical History  Procedure Laterality Date  . Vaginal delivery      2000, 2004, and 2010  . Mouth surgery      teeth removal, freniolectomy    There were no vitals filed for this visit.      Subjective Assessment - 05/16/16 0753    Subjective L shoulder feels good; pain has been intermittent.  Reports pain is not like it was before and motions is improving   Currently in Pain? No/denies                         [facility] Adult PT Treatment/Exercise - 05/16/16 0755    Shoulder Exercises: Supine   Horizontal ABduction Both;15 reps;Theraband   Theraband Level (Shoulder Horizontal ABduction) Level 1 (Yellow)   External Rotation Both;15 reps;Theraband   Theraband Level (Shoulder External Rotation) Level 1 (Yellow)   Other Supine Exercises on 1/2 foam roll for all exercises   Shoulder Exercises: Prone   Retraction Left;20 reps;Weights   Retraction Weight (lbs) 3   Retraction Limitations row   Extension Left;20 reps;Weights   Extension Weight (lbs) 3   Horizontal ABduction 1 Left;20 reps;Weights   Horizontal ABduction 1 Weight (lbs) 1   Shoulder Exercises: ROM/Strengthening   UBE (Upper Arm Bike) L 3 x 6 min (3'  fwd / 3' bwd)   Cybex Row Limitations 25# 2x10 both grips   Other ROM/Strengthening Exercises lat pulldowns 20# 2x10   Shoulder Exercises: Stretch   Other Shoulder Stretches supine chest stretch on noodle x 2 min                PT Education - 05/16/16 0829    Education provided Yes   Education Details posture   Person(s) Educated Patient   Methods Explanation;Handout   Comprehension Verbalized understanding             PT Long Term Goals - 05/16/16 0830    PT LONG TERM GOAL #1   Title independent with HEP (06/03/16)   Status Achieved   PT LONG TERM GOAL #2   Title verbalize understanding of posture to decrease risk of reinjury (06/03/16)   Status Achieved   PT LONG TERM GOAL #3   Title improve L shoulder ROM to WNL for improved ability to perform activities without modification (06/03/16)   Status On-going   PT LONG TERM GOAL #4   Title improve L shoulder strength to at least 4/5 for improved function    Status Partially Met               Plan - 05/16/16  0829    Clinical Impression Statement Pt progressing well with minimal pain reported.  May be ready for d/c v hold next week if continues to have minimal pain.     PT Next Visit Plan possible hold; continue per POC; manual for ROM and gentle strengthening exercises   Consulted and Agree with Plan of Care Patient      Patient will benefit from skilled therapeutic intervention in order to improve the following deficits and impairments:  Postural dysfunction, Decreased strength, Pain, Hypermobility, Impaired UE functional use, Decreased range of motion  Visit Diagnosis: Pain in left shoulder  Stiffness of left shoulder, not elsewhere classified  Abnormal posture     Problem List Patient Active Problem List   Diagnosis Date Noted  . Left shoulder pain 04/10/2016  . PCP NOTES >>>>>>>>>>>>>>>> 04/09/2016  . Annual physical exam 04/02/2016  . Depression 06/26/2015   Laureen Abrahams, PT,  DPT 05/16/2016 8:31 AM  [facility] [address]  [place], [place], [address] Phone: [phone]   Fax:  [phone]  Name: Rita Lambert MRN: [id number] Date of Birth: [birth year]

## 2016-05-20 ENCOUNTER — Ambulatory Visit (INDEPENDENT_AMBULATORY_CARE_PROVIDER_SITE_OTHER): Payer: 59 | Admitting: Family Medicine

## 2016-05-20 ENCOUNTER — Encounter: Payer: Self-pay | Admitting: Family Medicine

## 2016-05-20 VITALS — BP 102/68 | HR 81 | Ht 71.0 in | Wt 210.0 lb

## 2016-05-20 DIAGNOSIS — M25512 Pain in left shoulder: Secondary | ICD-10-CM

## 2016-05-20 NOTE — Patient Instructions (Signed)
Continue the home exercises for 6 more weeks. Call us if you have any problems otherwise follow up as needed.

## 2016-05-21 ENCOUNTER — Ambulatory Visit: Payer: 59

## 2016-05-21 NOTE — Assessment & Plan Note (Signed)
consistent with overuse strain of supraspinatus that has become impingement since then.  Clinically improving with physical therapy, home exercises.  Continue home exercises for 6 more weeks.  F/u prn.  Consider imaging, subacromial injection, nitro patches if not improving.

## 2016-05-21 NOTE — Progress Notes (Signed)
PCP and consultation requested by: Willow Ora, MD  Subjective:   HPI: Patient is a 50 y.o. female here for left shoulder pain.  5/31: Patient reports she recalls hanging clothes a year ago in the closet and started to get worsening lateral left shoulder pain. Has continued to have problems especially reaching out to side, lying on the left side since then. Pain is 0/10 at rest, up to 8/10 and sharp. No prior issues with this shoulder. Difficulty with externally rotating. No skin changes, numbness.  7/11: Patient reports she is doing better since last visit. Pain is 0/10 at rest, up to only 3/10 sharp with certain movements lateral shoulder. Doing well with physical therapy and home exercises. Not taking mobic or other medication. No skin changes, numbness.  Past Medical History  Diagnosis Date  . Allergy   . Depression   . Contraception     condoms    Current Outpatient Prescriptions on File Prior to Visit  Medication Sig Dispense Refill  . ibuprofen (ADVIL,MOTRIN) 200 MG tablet Take 400 mg by mouth as needed for mild pain or moderate pain.    . meloxicam (MOBIC) 15 MG tablet Take 1 tablet (15 mg total) by mouth daily. 30 tablet 2  . mometasone (NASONEX) 50 MCG/ACT nasal spray Place 2 sprays into the nose daily as needed. During Spring    . sertraline (ZOLOFT) 50 MG tablet Take 1 tablet (50 mg total) by mouth daily. 90 tablet 1   No current facility-administered medications on file prior to visit.    Past Surgical History  Procedure Laterality Date  . Vaginal delivery      2000, 2004, and 2010  . Mouth surgery      teeth removal, freniolectomy    No Known Allergies  Social History   Social History  . Marital Status: Married    Spouse Name: N/A  . Number of Children: 3  . Years of Education: N/A   Occupational History  . RN, health care communications ,  has a PhD    Social History Main Topics  . Smoking status: Never Smoker   . Smokeless tobacco: Not on  file  . Alcohol Use: 0.0 oz/week    0 Standard drinks or equivalent per week     Comment: Social  . Drug Use: No  . Sexual Activity: Not on file   Other Topics Concern  . Not on file   Social History Narrative   Has a PhD   3 children Born 2000, 2004, 2010    Family History  Problem Relation Age of Onset  . Breast cancer Other     aunt  . Colon cancer Neg Hx   . CAD Other     GF, uncle  . Diabetes Neg Hx   . Leukemia Mother     BP 102/68 mmHg  Pulse 81  Ht  (1.803 m)  Wt 210 lb (95.255 kg)  BMI 29.30 kg/m2  Review of Systems: See HPI above.    Objective:  Physical Exam:  Gen: NAD, comfortable in exam room  Left shoulder: No swelling, ecchymoses.  No gross deformity. No TTP. FROM with minimal painful arc. Negative Hawkins, Neers. Negative Yergasons. Strength 5/5 with empty can and resisted internal/external rotation. Negative apprehension. 1+ sulcus but same on right side. NV intact distally.  Right shoulder: FROM without pain.    Assessment & Plan:  1. Left shoulder pain - consistent with overuse strain of supraspinatus that has become impingement since  then.  Clinically improving with physical therapy, home exercises.  Continue home exercises for 6 more weeks.  F/u prn.  Consider imaging, subacromial injection, nitro patches if not improving.

## 2016-05-23 ENCOUNTER — Ambulatory Visit: Payer: 59

## 2016-05-28 ENCOUNTER — Ambulatory Visit: Payer: 59 | Admitting: Physical Therapy

## 2016-05-28 DIAGNOSIS — R293 Abnormal posture: Secondary | ICD-10-CM

## 2016-05-28 DIAGNOSIS — M25612 Stiffness of left shoulder, not elsewhere classified: Secondary | ICD-10-CM

## 2016-05-28 DIAGNOSIS — M25512 Pain in left shoulder: Secondary | ICD-10-CM | POA: Diagnosis not present

## 2016-05-28 NOTE — Therapy (Signed)
[facility] [address]  [place], [place], [address] Phone: [phone]   Fax:  [phone]  Physical Therapy Treatment/Discharge  Patient Details  Name: Rita Lambert MRN: [id number] Date of Birth: 04-Mar-1966 Referring Provider: Dr. Karlton Lemon  Encounter Date: 05/28/2016      PT End of Session - 05/28/16 1100    Visit Number 7   PT Start Time 1042  pt arrived late and session ended early   PT Stop Time 1100   PT Time Calculation (min) 18 min   Activity Tolerance Patient tolerated treatment well   Behavior During Therapy [facility] for tasks assessed/performed      Past Medical History  Diagnosis Date  . Allergy   . Depression   . Contraception     condoms    Past Surgical History  Procedure Laterality Date  . Vaginal delivery      2000, 2004, and 2010  . Mouth surgery      teeth removal, freniolectomy    There were no vitals filed for this visit.      Subjective Assessment - 05/28/16 1043    Subjective doing well; went to MD and ready for d/c today   Patient Stated Goals improve motion, decreased pain with reaching   Currently in Pain? No/denies            [facility] PT Assessment - 05/28/16 1050    Observation/Other Assessments   Focus on Therapeutic Outcomes (FOTO)  67 (33% limited)   AROM   Left Shoulder Flexion 148 Degrees   Left Shoulder ABduction 143 Degrees   Left Shoulder Internal Rotation 90 Degrees   Left Shoulder External Rotation 50 Degrees   Strength   Left Shoulder Flexion 5/5   Left Shoulder ABduction 5/5   Left Shoulder External Rotation 4+/5                     [facility] Adult PT Treatment/Exercise - 05/28/16 1044    Shoulder Exercises: ROM/Strengthening   UBE (Upper Arm Bike) L 3 x 4 min (2' fwd / 2' bwd)                PT Education - 05/28/16 1100    Education provided Yes   Education Details need for continued exercise; return to gym and proper  progression of exercises   Person(s) Educated Patient   Methods Explanation   Comprehension Verbalized understanding             PT Long Term Goals - 05/28/16 1101    PT LONG TERM GOAL #1   Title independent with HEP (06/03/16)   Status Achieved   PT LONG TERM GOAL #2   Title verbalize understanding of posture to decrease risk of reinjury (06/03/16)   Status Achieved   PT LONG TERM GOAL #3   Title improve L shoulder ROM to WNL for improved ability to perform activities without modification (06/03/16)   Status Achieved   PT LONG TERM GOAL #4   Title improve L shoulder strength to at least 4/5 for improved function    Status Achieved               Plan - 05/28/16 1100    Clinical Impression Statement Pt has met all goals and ready for d/c.  Reinforced need to continue HEP and safe return to gym exercises.  Pt verbalized understanding   PT Next Visit Plan d/c PT today  Patient will benefit from skilled therapeutic intervention in order to improve the following deficits and impairments:  Postural dysfunction, Decreased strength, Pain, Hypermobility, Impaired UE functional use, Decreased range of motion  Visit Diagnosis: Pain in left shoulder  Stiffness of left shoulder, not elsewhere classified  Abnormal posture     Problem List Patient Active Problem List   Diagnosis Date Noted  . Left shoulder pain 04/10/2016  . PCP NOTES >>>>>>>>>>>>>>>> 04/09/2016  . Annual physical exam 04/02/2016  . Depression 06/26/2015   Laureen Abrahams, PT, DPT 05/28/2016 11:05 AM  [facility] [address]  [place], [place], [address] Phone: [phone]   Fax:  [phone]  Name: Rita Lambert MRN: [id number] Date of Birth: Dec 09, 1965        PHYSICAL THERAPY DISCHARGE SUMMARY  Visits from Start of Care: 7  Current functional level related to goals / functional outcomes: See above    Remaining deficits: Pt has some residual pain with end range movements; but reports this is significantly decreased and feels will continue to improve with exercises at home   Education / Equipment: HEP, posture  Plan: Patient agrees to discharge.  Patient goals were met. Patient is being discharged due to meeting the stated rehab goals.  ?????    Laureen Abrahams, PT, DPT 05/28/2016 11:06 AM  [facility] [place]. [place], [place] [address]  [phone] (office) [phone] (fax)

## 2016-06-17 ENCOUNTER — Other Ambulatory Visit: Payer: Self-pay | Admitting: Internal Medicine

## 2016-08-10 ENCOUNTER — Other Ambulatory Visit: Payer: Self-pay | Admitting: Internal Medicine

## 2016-08-11 ENCOUNTER — Other Ambulatory Visit: Payer: Self-pay | Admitting: Internal Medicine

## 2016-08-12 ENCOUNTER — Other Ambulatory Visit: Payer: Self-pay | Admitting: Internal Medicine

## 2016-08-14 ENCOUNTER — Other Ambulatory Visit: Payer: Self-pay | Admitting: Internal Medicine

## 2017-07-11 ENCOUNTER — Other Ambulatory Visit: Payer: Self-pay | Admitting: Internal Medicine

## 2017-07-17 ENCOUNTER — Other Ambulatory Visit: Payer: Self-pay | Admitting: Internal Medicine

## 2017-08-05 ENCOUNTER — Other Ambulatory Visit: Payer: Self-pay | Admitting: Internal Medicine

## 2017-08-06 ENCOUNTER — Other Ambulatory Visit: Payer: Self-pay | Admitting: Internal Medicine

## 2017-08-07 ENCOUNTER — Other Ambulatory Visit: Payer: Self-pay | Admitting: Internal Medicine

## 2017-08-11 ENCOUNTER — Other Ambulatory Visit: Payer: Self-pay | Admitting: Internal Medicine

## 2017-08-12 ENCOUNTER — Other Ambulatory Visit: Payer: Self-pay | Admitting: Internal Medicine

## 2017-09-12 ENCOUNTER — Other Ambulatory Visit: Payer: Self-pay | Admitting: Internal Medicine

## 2017-09-23 ENCOUNTER — Encounter: Payer: 59 | Admitting: Internal Medicine

## 2017-11-13 ENCOUNTER — Encounter: Payer: Self-pay | Admitting: Internal Medicine

## 2017-12-01 ENCOUNTER — Encounter: Payer: Self-pay | Admitting: Internal Medicine

## 2017-12-01 ENCOUNTER — Ambulatory Visit (INDEPENDENT_AMBULATORY_CARE_PROVIDER_SITE_OTHER): Payer: 59 | Admitting: Internal Medicine

## 2017-12-01 VITALS — BP 128/68 | HR 68 | Temp 98.4°F | Resp 14 | Ht 71.0 in | Wt 229.1 lb

## 2017-12-01 DIAGNOSIS — Z0000 Encounter for general adult medical examination without abnormal findings: Secondary | ICD-10-CM | POA: Diagnosis not present

## 2017-12-01 MED ORDER — SERTRALINE HCL 50 MG PO TABS: 50.0000 mg | ORAL_TABLET | Freq: Every day | ORAL | 3 refills | Status: DC

## 2017-12-01 NOTE — Progress Notes (Signed)
Pre visit review using our clinic review tool, if applicable. No additional management support is needed unless otherwise documented below in the visit note. 

## 2017-12-01 NOTE — Progress Notes (Signed)
Subjective:    Patient ID: Rita KirschnerDonna L Harner, female    DOB: 03/19/1966, 52 y.o.   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 Working long hours, not doing well with diet, has gained weight.  No time to exercise.  Despite increased stress, Zoloft is doing well for her and request refill.  Wt Readings from Last 3 Encounters:  12/01/17 229 lb 2 oz (103.9 kg)  05/20/16 210 lb (95.3 kg)  04/09/16 220 lb (99.8 kg)     Review of Systems Ears feel stopped up, on and off for a while, no discharge, pain or dizziness Periods some somewhat irregular lately, "I know I am getting perimenopausal"  Other than above, a 14 point review of systems is negative    Past Medical History:  Diagnosis Date  . Allergy   . Contraception    condoms  . Depression     Past Surgical History:  Procedure Laterality Date  . MOUTH SURGERY     teeth removal, freniolectomy  . VAGINAL DELIVERY     2000, 2004, and 2010    Social History   Socioeconomic History  . Marital status: Married    Spouse name: Not on file  . Number of children: 3  . Years of education: Not on file  . Highest education level: Not on file  Social Needs  . Financial resource strain: Not on file  . Food insecurity - worry: Not on file  . Food insecurity - inability: Not on file  . Transportation needs - medical: Not on file  . Transportation needs - non-medical: Not on file  Occupational History  . Occupation: acute care psych inpatient and HH--RN, has a PhD  Tobacco Use  . Smoking status: Never Smoker  . Smokeless tobacco: Never Used  Substance and Sexual Activity  . Alcohol use: Yes    Alcohol/week: 0.0 oz    Comment: Social  . Drug use: No  . Sexual activity: Not on file  Other Topics Concern  . Not on file  Social History Narrative   Has a PhD   3  children Born 2000, 2004, 2010     Family History  Problem Relation Age of Onset  . Breast cancer Other        aunt  . CAD Other        GF, uncle  . Leukemia Mother 348  . Colon cancer Neg Hx   . Diabetes Neg Hx      Allergies as of 12/01/2017   No Known Allergies     Medication List        Accurate as of 12/01/17 11:59 PM. Always use your most recent med list.          sertraline 50 MG tablet Commonly known as:  ZOLOFT Take 1 tablet (50 mg total) by mouth daily.          Objective:   Physical Exam BP 128/68 (BP Location: Left Arm, Patient Position: Sitting, Cuff Size: Normal)   Pulse 68   Temp 98.4 F (36.9 C) (Oral)   Resp 14   Ht 5\' 11"  (1.803 m)   Wt 229 lb 2 oz (103.9 kg)   LMP 11/24/2017 (Approximate)   SpO2 97%   BMI 31.96 kg/m  General:   Well developed, well nourished . NAD.  Neck:  No  thyromegaly  HEENT:  Normocephalic . Face symmetric, atraumatic. Bilateral ears: + Cerumen impaction Lungs:  CTA B Normal respiratory effort, no intercostal retractions, no accessory muscle use. Heart: RRR,  no murmur.  No pretibial edema bilaterally  Abdomen:  Not distended, soft, non-tender. No rebound or rigidity.   Skin: Exposed areas without rash. Not pale. Not jaundice Neurologic:  alert & oriented X3.  Speech normal, gait appropriate for age and unassisted Strength symmetric and appropriate for age.  Psych: Cognition and judgment appear intact.  Cooperative with normal attention span and concentration.  Behavior appropriate. No anxious or depressed appearing.     Assessment & Plan:  Assessment Depression Allergies  Contraception: Condoms Perimenopausal as of 11-2017  Plan: Depression: Increased stress lately but she is doing well, refill Zoloft Cerumen impaction: We talk about possibly irrigation today but we decide to do H2 O2 at home and self irrigation.  Will call if not better. RTC 1 year

## 2017-12-01 NOTE — Patient Instructions (Signed)
  GO TO THE FRONT DESK Schedule labs to be done fasting this week  Schedule your next appointment for a  Physical exam in 1 year      GENERAL INFORMATION ABOUT HEALTHY EATING, CHOLESTEROL, HIGH BLOOD PRESSURE :  The American Heart Association, www.heart.org  Check the "Life's Simple 7" from the [facility] The American diabetes Association www.diabetes.org  "[facility] A to Z Health Guide" book

## 2017-12-01 NOTE — Assessment & Plan Note (Addendum)
-   Td  04-02-16; had a flu shot; shingrix discussed - CCS: never had a cscope, 3 modalities discussed, IFOB provided, will call when/if ready for cscope or Cologuard - Female Care : feels she is perimenopausal, to see a new gyn 12-2017; plans to schedule a MMG - Diet, exercise: discussed  -RTC fasting CMP, FLP, CBC, A1c, TSH

## 2017-12-02 NOTE — Assessment & Plan Note (Signed)
Depression: Increased stress lately but she is doing well, refill Zoloft Cerumen impaction: We talk about possibly irrigation today but we decide to do H2 O2 at home and self irrigation.  Will call if not better. RTC 1 year

## 2017-12-04 ENCOUNTER — Other Ambulatory Visit (INDEPENDENT_AMBULATORY_CARE_PROVIDER_SITE_OTHER): Payer: 59

## 2017-12-04 DIAGNOSIS — Z0000 Encounter for general adult medical examination without abnormal findings: Secondary | ICD-10-CM | POA: Diagnosis not present

## 2017-12-04 LAB — HEMOGLOBIN A1C: HEMOGLOBIN A1C: 5.8 % (ref 4.6–6.5)

## 2017-12-04 LAB — CBC WITH DIFFERENTIAL/PLATELET
BASOS ABS: 0 10*3/uL (ref 0.0–0.1)
Basophils Relative: 0.5 % (ref 0.0–3.0)
EOS ABS: 0.3 10*3/uL (ref 0.0–0.7)
Eosinophils Relative: 3.3 % (ref 0.0–5.0)
HCT: 39.4 % (ref 36.0–46.0)
Hemoglobin: 13 g/dL (ref 12.0–15.0)
LYMPHS ABS: 1.5 10*3/uL (ref 0.7–4.0)
Lymphocytes Relative: 18.6 % (ref 12.0–46.0)
MCHC: 32.9 g/dL (ref 30.0–36.0)
MCV: 82.1 fl (ref 78.0–100.0)
MONO ABS: 0.5 10*3/uL (ref 0.1–1.0)
MONOS PCT: 5.8 % (ref 3.0–12.0)
NEUTROS ABS: 5.8 10*3/uL (ref 1.4–7.7)
Neutrophils Relative %: 71.8 % (ref 43.0–77.0)
PLATELETS: 275 10*3/uL (ref 150.0–400.0)
RBC: 4.8 Mil/uL (ref 3.87–5.11)
RDW: 15.2 % (ref 11.5–15.5)
WBC: 8 10*3/uL (ref 4.0–10.5)

## 2017-12-04 LAB — LIPID PANEL
CHOLESTEROL: 183 mg/dL (ref 0–200)
HDL: 59.2 mg/dL (ref >39.00)
LDL Cholesterol: 115 mg/dL — ABNORMAL HIGH (ref 0–99)
NONHDL: 123.41
Total CHOL/HDL Ratio: 3
Triglycerides: 42 mg/dL (ref 0.0–149.0)
VLDL: 8.4 mg/dL (ref 0.0–40.0)

## 2017-12-04 LAB — COMPREHENSIVE METABOLIC PANEL
ALBUMIN: 4.1 g/dL (ref 3.5–5.2)
ALK PHOS: 71 U/L (ref 39–117)
ALT: 8 U/L (ref 0–35)
AST: 13 U/L (ref 0–37)
BUN: 13 mg/dL (ref 6–23)
CO2: 28 mEq/L (ref 19–32)
Calcium: 8.8 mg/dL (ref 8.4–10.5)
Chloride: 104 mEq/L (ref 96–112)
Creatinine, Ser: 0.91 mg/dL (ref 0.40–1.20)
GFR: 69.45 mL/min (ref >60.00)
GLUCOSE: 104 mg/dL — AB (ref 70–99)
Potassium: 4.1 mEq/L (ref 3.5–5.1)
Sodium: 140 mEq/L (ref 135–145)
Total Bilirubin: 0.5 mg/dL (ref 0.2–1.2)
Total Protein: 6.8 g/dL (ref 6.0–8.3)

## 2017-12-04 LAB — TSH: TSH: 1.27 u[IU]/mL (ref 0.35–4.50)

## 2018-01-05 LAB — HM MAMMOGRAPHY

## 2018-06-23 ENCOUNTER — Encounter: Payer: Self-pay | Admitting: Internal Medicine

## 2018-06-29 ENCOUNTER — Encounter: Payer: Self-pay | Admitting: Internal Medicine

## 2018-07-13 ENCOUNTER — Encounter: Payer: Self-pay | Admitting: Internal Medicine

## 2018-08-15 ENCOUNTER — Encounter (HOSPITAL_BASED_OUTPATIENT_CLINIC_OR_DEPARTMENT_OTHER): Payer: Self-pay | Admitting: Emergency Medicine

## 2018-08-15 ENCOUNTER — Other Ambulatory Visit: Payer: Self-pay

## 2018-08-15 ENCOUNTER — Emergency Department (HOSPITAL_BASED_OUTPATIENT_CLINIC_OR_DEPARTMENT_OTHER)
Admission: EM | Admit: 2018-08-15 | Discharge: 2018-08-15 | Disposition: A | Discharge status: Home / Self Care | Payer: BLUE CROSS/BLUE SHIELD | Attending: Emergency Medicine | Admitting: Emergency Medicine

## 2018-08-15 ENCOUNTER — Telehealth: Payer: Self-pay | Admitting: Internal Medicine

## 2018-08-15 DIAGNOSIS — Z79899 Other long term (current) drug therapy: Secondary | ICD-10-CM | POA: Diagnosis not present

## 2018-08-15 DIAGNOSIS — N644 Mastodynia: Secondary | ICD-10-CM | POA: Diagnosis present

## 2018-08-15 DIAGNOSIS — N61 Mastitis without abscess: Secondary | ICD-10-CM

## 2018-08-15 DIAGNOSIS — R000 Tachycardia, unspecified: Secondary | ICD-10-CM | POA: Diagnosis not present

## 2018-08-15 LAB — COMPREHENSIVE METABOLIC PANEL
ALK PHOS: 70 U/L (ref 38–126)
ALT: 12 U/L (ref 0–44)
ANION GAP: 11 (ref 5–15)
AST: 17 U/L (ref 15–41)
Albumin: 4.3 g/dL (ref 3.5–5.0)
BILIRUBIN TOTAL: 0.7 mg/dL (ref 0.3–1.2)
BUN: 17 mg/dL (ref 6–20)
CALCIUM: 9 mg/dL (ref 8.9–10.3)
CO2: 22 mmol/L (ref 22–32)
CREATININE: 0.92 mg/dL (ref 0.44–1.00)
Chloride: 103 mmol/L (ref 98–111)
GFR calc non Af Amer: >60 mL/min (ref >60)
GLUCOSE: 126 mg/dL — AB (ref 70–99)
Potassium: 3.7 mmol/L (ref 3.5–5.1)
Sodium: 136 mmol/L (ref 135–145)
TOTAL PROTEIN: 7.4 g/dL (ref 6.5–8.1)

## 2018-08-15 LAB — CBC WITH DIFFERENTIAL/PLATELET
BASOS ABS: 0 10*3/uL (ref 0.0–0.1)
BASOS PCT: 0 %
Eosinophils Absolute: 0.1 10*3/uL (ref 0.0–0.7)
Eosinophils Relative: 1 %
HEMATOCRIT: 39.3 % (ref 36.0–46.0)
Hemoglobin: 12.7 g/dL (ref 12.0–15.0)
Lymphocytes Relative: 4 %
Lymphs Abs: 0.6 10*3/uL — ABNORMAL LOW (ref 0.7–4.0)
MCH: 26 pg (ref 26.0–34.0)
MCHC: 32.3 g/dL (ref 30.0–36.0)
MCV: 80.5 fL (ref 78.0–100.0)
MONO ABS: 0.7 10*3/uL (ref 0.1–1.0)
Monocytes Relative: 4 %
NEUTROS ABS: 15.3 10*3/uL — AB (ref 1.7–7.7)
Neutrophils Relative %: 91 %
Platelets: 241 10*3/uL (ref 150–400)
RBC: 4.88 MIL/uL (ref 3.87–5.11)
RDW: 15.1 % (ref 11.5–15.5)
WBC: 16.7 10*3/uL — ABNORMAL HIGH (ref 4.0–10.5)

## 2018-08-15 LAB — I-STAT CG4 LACTIC ACID, ED: Lactic Acid, Venous: 1.48 mmol/L (ref 0.5–1.9)

## 2018-08-15 MED ORDER — CEPHALEXIN 500 MG PO CAPS: 500.0000 mg | ORAL_CAPSULE | Freq: Four times a day (QID) | ORAL | 0 refills | Status: DC

## 2018-08-15 MED ORDER — DOXYCYCLINE HYCLATE 100 MG PO CAPS: 100.0000 mg | ORAL_CAPSULE | Freq: Two times a day (BID) | ORAL | 0 refills | Status: DC

## 2018-08-15 MED ORDER — NAPROXEN 500 MG PO TABS: 500.0000 mg | ORAL_TABLET | Freq: Two times a day (BID) | ORAL | 0 refills | Status: DC

## 2018-08-15 NOTE — Telephone Encounter (Signed)
Was seen at the ER with mastitis, a infection of the breast, needs follow-up, please arrange (unless she prefers to see her gynecologist)

## 2018-08-15 NOTE — Discharge Instructions (Signed)
You are seen in the emergency department today for breast pain and fever.  We suspect your symptoms are consistent with mastitis.  Your lab work was all fairly reassuring, your white blood cell count is elevated which can occur with infection.  We are sending you home with antibiotics including doxycycline and Keflex to cover the infection.  Please take the antibiotics as prescribed.  We also send you home with a prescription for naproxen to help with pain. Naproxen is a nonsteroidal anti-inflammatory medication that will help with pain and swelling. Be sure to take this medication as prescribed with food, 1 pill every 12 hours,  It should be taken with food, as it can cause stomach upset, and more seriously, stomach bleeding. Do not take other nonsteroidal anti-inflammatory medications with this such as Advil, Motrin, Aleve, Mobic, Goodie Powder, or Motrin.    You make take Tylenol per over the counter dosing with these medications.   We have prescribed you new medication(s) today. Discuss the medications prescribed today with your pharmacist as they can have adverse effects and interactions with your other medicines including over the counter and prescribed medications. Seek medical evaluation if you start to experience new or abnormal symptoms after taking one of these medicines, seek care immediately if you start to experience difficulty breathing, feeling of your throat closing, facial swelling, or rash as these could be indications of a more serious allergic reaction  We would like you to follow-up with your primary care provider within 3 days for reevaluation.  Please return to the ER for new or worsening symptoms including but not limited to worsening pain, fever not improved by Tylenol, inability to keep fluids down, spreading redness, or any other concerns that you may have.

## 2018-08-15 NOTE — ED Triage Notes (Addendum)
Pt reports R breast pain and fever since this morning. Pt took ibuprofen this morning PTA. 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

## 2018-08-15 NOTE — ED Provider Notes (Signed)
[facility] EMERGENCY DEPARTMENT Provider Note   CSN: [id number] Arrival date & time: 08/15/18  0931     History   Chief Complaint Chief Complaint  Patient presents with  . Breast Pain    HPI Rita Lambert is a 52 y.o. female with a hx of depression and prior mastoiditis as well as prior MRSA who presents to the ED with complaints of right breast pain that started this AM at 0400. Patient states she woke from sleep with pain to the R breast with associated subjective fever, chills, and generalized aches. She states that the breast pain was fairly central and radiated to the axillary area. She took 800 mg of ibuprofen at 0800 with resolution of her fever/chills/aches and improvement in her breast discomfort. She states she is now only having pain with palpation. She has noted that this morning the breast looked a bit red. She states she has a hx of mastoiditis when breast feeding several years ago and this feels exactly the same- each time treated with outpatient abx. Denies URI sxs, chest pain, dyspnea, nausea, vomiting, abdominal pain, or dysuria. Patient is currently menstruating, she has had irregular cycles as she feels she is going through menopause.   HPI  Past Medical History:  Diagnosis Date  . Allergy   . Contraception    condoms  . Depression     Patient Active Problem List   Diagnosis Date Noted  . Left shoulder pain 04/10/2016  . PCP NOTES >>>>>>>>>>>>>>>> 04/09/2016  . Annual physical exam 04/02/2016  . Depression 06/26/2015    Past Surgical History:  Procedure Laterality Date  . MOUTH SURGERY     teeth removal, freniolectomy  . VAGINAL DELIVERY     2000, 2004, and 2010     OB History   None      Home Medications    Prior to Admission medications   Medication Sig Start Date End Date Taking? Authorizing Provider  sertraline (ZOLOFT) 50 MG tablet Take 1 tablet (50 mg total) by mouth daily. 12/01/17   Wanda Plump, MD    Family  History Family History  Problem Relation Age of Onset  . Breast cancer Other        aunt  . CAD Other        GF, uncle  . Leukemia Mother 58  . Colon cancer Neg Hx   . Diabetes Neg Hx     Social History Social History   Tobacco Use  . Smoking status: Never Smoker  . Smokeless tobacco: Never Used  Substance Use Topics  . Alcohol use: Yes    Alcohol/week: 0.0 standard drinks    Comment: Social  . Drug use: No     Allergies   Patient has no known allergies.   Review of Systems Review of Systems  Constitutional: Positive for chills and fever.  HENT: Negative for congestion, ear pain, rhinorrhea and sore throat.   Respiratory: Negative for cough and shortness of breath.   Cardiovascular: Negative for chest pain.  Gastrointestinal: Negative for abdominal pain, nausea and vomiting.  Genitourinary: Negative for dysuria.       + for breast pain  Musculoskeletal: Positive for arthralgias and myalgias.  Skin: Positive for color change.  All other systems reviewed and are negative.    Physical Exam Updated Vital Signs BP 127/80 (BP Location: Left Arm)   Pulse (!) 123   Temp 98.9 F (37.2 C) (Oral)   Resp (!) 24   Ht  5\' 11"  (1.803 m)   Wt 105.1 kg   SpO2 98%   BMI 32.32 kg/m   Physical Exam  Constitutional: She appears well-developed and well-nourished.  Non-toxic appearance. No distress.  HENT:  Head: Normocephalic and atraumatic.  Right Ear: Tympanic membrane normal.  Left Ear: Tympanic membrane normal.  Nose: Nose normal.  Mouth/Throat: Uvula is midline and oropharynx is clear and moist.  Eyes: Conjunctivae are normal. Right eye exhibits no discharge. Left eye exhibits no discharge.  Neck: Neck supple.  Cardiovascular: Regular rhythm. Tachycardia present.  No murmur heard. Pulmonary/Chest: Effort normal and breath sounds normal. No respiratory distress. She has no wheezes. She has no rhonchi. She has no rales. Right breast exhibits tenderness (just superior  to the nipple area with overlying erythema and warmth. no palpable fluctuance, no gross abscess, no streaking appearance to the erythema). Right breast exhibits no inverted nipple, no mass and no nipple discharge. Left breast exhibits no inverted nipple, no mass, no nipple discharge and no tenderness.  Respiration even and unlabored. EDT Marylu Lund present as chaperone.   Abdominal: Soft. She exhibits no distension. There is no tenderness.  Neurological: She is alert.  Clear speech.   Skin: Skin is warm and dry. No rash noted.  Psychiatric: She has a normal mood and affect. Her behavior is normal.  Nursing note and vitals reviewed.    ED Treatments / Results  Labs Results for orders placed or performed during the hospital encounter of 08/15/18  Comprehensive metabolic panel  Result Value Ref Range   Sodium 136 135 - 145 mmol/L   Potassium 3.7 3.5 - 5.1 mmol/L   Chloride 103 98 - 111 mmol/L   CO2 22 22 - 32 mmol/L   Glucose, Bld 126 (H) 70 - 99 mg/dL   BUN 17 6 - 20 mg/dL   Creatinine, Ser 4.09 0.44 - 1.00 mg/dL   Calcium 9.0 8.9 - 81.1 mg/dL   Total Protein 7.4 6.5 - 8.1 g/dL   Albumin 4.3 3.5 - 5.0 g/dL   AST 17 15 - 41 U/L   ALT 12 0 - 44 U/L   Alkaline Phosphatase 70 38 - 126 U/L   Total Bilirubin 0.7 0.3 - 1.2 mg/dL   GFR calc non Af Amer >60 >60 mL/min   GFR calc Af Amer >60 >60 mL/min   Anion gap 11 5 - 15  CBC with Differential  Result Value Ref Range   WBC 16.7 (H) 4.0 - 10.5 K/uL   RBC 4.88 3.87 - 5.11 MIL/uL   Hemoglobin 12.7 12.0 - 15.0 g/dL   HCT 91.4 78.2 - 95.6 %   MCV 80.5 78.0 - 100.0 fL   MCH 26.0 26.0 - 34.0 pg   MCHC 32.3 30.0 - 36.0 g/dL   RDW 21.3 08.6 - 57.8 %   Platelets 241 150 - 400 K/uL   Neutrophils Relative % 91 %   Neutro Abs 15.3 (H) 1.7 - 7.7 K/uL   Lymphocytes Relative 4 %   Lymphs Abs 0.6 (L) 0.7 - 4.0 K/uL   Monocytes Relative 4 %   Monocytes Absolute 0.7 0.1 - 1.0 K/uL   Eosinophils Relative 1 %   Eosinophils Absolute 0.1 0.0 - 0.7  K/uL   Basophils Relative 0 %   Basophils Absolute 0.0 0.0 - 0.1 K/uL  I-Stat CG4 Lactic Acid, ED  Result Value Ref Range   Lactic Acid, Venous 1.48 0.5 - 1.9 mmol/L   No results found.  EKG None  Radiology  No results found.  Procedures Procedures (including critical care time)  Medications Ordered in ED Medications - No data to display   Initial Impression / Assessment and Plan / ED Course  I have reviewed the triage vital signs and the nursing notes.  Pertinent labs & imaging results that were available during my care of the patient were reviewed by me and considered in my medical decision making (see chart for details).   Patient presents to the ED with complaints of right breast pain with associated fever/chills/achiness. Patient nontoxic appearing, afebrile on arrival after anti-pyretics this AM, noted to be tachycardic initially however patient reports HR typically runs over 100 with anxiety. HR on monitor without medical personal in patient's exam room appears to be consistently in the 90s on my review. Exam appears consistent with mastitis without obvious abscess. She has associated leukocytosis at 16.7 with left shift. Her lactic acid is WNL. No significant electrolyte disturbances.   Will cover with Keflex and Doxycycline, Naproxen prescription for pain (renal function WNL). Patient to follow up with her primary care provider.   I discussed results, treatment plan, need for PCP follow-up, and return precautions with the patient. Provided opportunity for questions, patient confirmed understanding and is in agreement with plan.   Findings and plan of care discussed with supervising physician Dr. Rhunette Croft who is in agreement.   Vitals:   08/15/18 0952 08/15/18 1052 08/15/18 1057 08/15/18 1058  BP:   111/72 111/72  Pulse:  94 95 88  Resp:  14 (!) 21 18  Temp: 98.9 F (37.2 C)     TempSrc: Oral     SpO2:  96% 96% 96%  Weight:      Height:        Final Clinical  Impressions(s) / ED Diagnoses   Final diagnoses:  Mastitis    ED Discharge Orders         Ordered    cephALEXin (KEFLEX) 500 MG capsule  4 times daily     08/15/18 1041    doxycycline (VIBRAMYCIN) 100 MG capsule  2 times daily     08/15/18 1041    naproxen (NAPROSYN) 500 MG tablet  2 times daily     08/15/18 [address] [place] R, PA-C 08/15/18 1247    Derwood Kaplan, MD 08/17/18 [phone]

## 2018-08-16 NOTE — Telephone Encounter (Signed)
Called pt and was unable to speak with the pt or LVM.

## 2018-08-16 NOTE — Telephone Encounter (Signed)
Needs ED f/u this week. Please schedule at her convenience.  

## 2018-10-27 ENCOUNTER — Other Ambulatory Visit: Payer: Self-pay | Admitting: Internal Medicine

## 2018-12-03 ENCOUNTER — Encounter: Payer: 59 | Admitting: Internal Medicine

## 2019-01-28 ENCOUNTER — Other Ambulatory Visit: Payer: Self-pay | Admitting: Internal Medicine

## 2019-02-03 ENCOUNTER — Encounter: Payer: BLUE CROSS/BLUE SHIELD | Admitting: Internal Medicine

## 2019-03-26 ENCOUNTER — Other Ambulatory Visit: Payer: Self-pay | Admitting: Internal Medicine

## 2019-03-28 MED ORDER — SERTRALINE HCL 50 MG PO TABS: 50.0000 mg | ORAL_TABLET | Freq: Every day | ORAL | 0 refills | Status: DC

## 2019-05-06 ENCOUNTER — Encounter: Payer: Self-pay | Admitting: Internal Medicine

## 2019-05-06 ENCOUNTER — Other Ambulatory Visit: Payer: Self-pay

## 2019-05-06 ENCOUNTER — Ambulatory Visit (INDEPENDENT_AMBULATORY_CARE_PROVIDER_SITE_OTHER): Payer: BC Managed Care – PPO | Admitting: Internal Medicine

## 2019-05-06 DIAGNOSIS — N951 Menopausal and female climacteric states: Secondary | ICD-10-CM | POA: Diagnosis not present

## 2019-05-06 DIAGNOSIS — Z20822 Contact with and (suspected) exposure to covid-19: Secondary | ICD-10-CM

## 2019-05-06 DIAGNOSIS — R5381 Other malaise: Secondary | ICD-10-CM | POA: Diagnosis not present

## 2019-05-06 DIAGNOSIS — Z0000 Encounter for general adult medical examination without abnormal findings: Secondary | ICD-10-CM

## 2019-05-06 DIAGNOSIS — Z0001 Encounter for general adult medical examination with abnormal findings: Secondary | ICD-10-CM | POA: Diagnosis not present

## 2019-05-06 DIAGNOSIS — F329 Major depressive disorder, single episode, unspecified: Secondary | ICD-10-CM

## 2019-05-06 DIAGNOSIS — R739 Hyperglycemia, unspecified: Secondary | ICD-10-CM

## 2019-05-06 DIAGNOSIS — Z20828 Contact with and (suspected) exposure to other viral communicable diseases: Secondary | ICD-10-CM | POA: Diagnosis not present

## 2019-05-06 DIAGNOSIS — R05 Cough: Secondary | ICD-10-CM

## 2019-05-06 DIAGNOSIS — F32A Depression, unspecified: Secondary | ICD-10-CM

## 2019-05-06 MED ORDER — SERTRALINE HCL 50 MG PO TABS: 75.0000 mg | ORAL_TABLET | Freq: Every day | ORAL | 2 refills | Status: DC

## 2019-05-06 NOTE — Progress Notes (Addendum)
Subjective:    Patient ID: Rita Lambert, female    DOB: [birth year], 53 y.o.   MRN: [id number]  DOS:  05/06/2019 Type of visit - description: Virtual Visit via Video Note  I connected with@ on 05/08/19 at  9:20 AM EDT by a video enabled telemedicine application and verified that I am speaking with the correct person using two identifiers.   THIS ENCOUNTER IS A VIRTUAL VISIT DUE TO COVID-19 - PATIENT WAS NOT SEEN IN THE OFFICE. PATIENT HAS CONSENTED TO VIRTUAL VISIT / TELEMEDICINE VISIT   Location of patient: home  Location of provider: office  I discussed the limitations of evaluation and management by telemedicine and the availability of in person appointments. The patient expressed understanding and agreed to proceed.  History of Present Illness: CPX Multiple other issues discussed She takes sertraline 50 mg a day but  due to perimenopausal symptoms, during her periods she takes 75 mg daily.  Wonders if she could take 75 mg every day. Has lost weight, doing weight watchers. Husband was diagnosed with COVID-19, was admitted to the hospital, he is back home and doing better.  Rita Lambert developed symptoms 3 days ago: Dry cough, chills, malaise, fatigue.  Was tested for COVID-19 2 days ago, results pending.    Wt Readings from Last 3 Encounters:  08/15/18 231 lb 11.3 oz (105.1 kg)  12/01/17 229 lb 2 oz (103.9 kg)  05/20/16 210 lb (95.3 kg)     Review of Systems Specifically denies chest pain or difficulty breathing  Other than above, a 14 point review of systems is negative    Past Medical History:  Diagnosis Date  . Allergy   . Contraception    condoms  . Depression     Past Surgical History:  Procedure Laterality Date  . MOUTH SURGERY     teeth removal, freniolectomy  . VAGINAL DELIVERY     2000, 2004, and 2010    Social History   Socioeconomic History  . Marital status: Married    Spouse name: Not on file  . Number of children: 3  . Years of education:  Not on file  . Highest education level: Not on file  Occupational History  . Occupation: acute care psych inpatient and HH--RN, has a PhD  Social Needs  . Financial resource strain: Not on file  . Food insecurity    Worry: Not on file    Inability: Not on file  . Transportation needs    Medical: Not on file    Non-medical: Not on file  Tobacco Use  . Smoking status: Never Smoker  . Smokeless tobacco: Never Used  Substance and Sexual Activity  . Alcohol use: Yes    Alcohol/week: 0.0 standard drinks    Comment: Social  . Drug use: No  . Sexual activity: Not on file  Lifestyle  . Physical activity    Days per week: Not on file    Minutes per session: Not on file  . Stress: Not on file  Relationships  . Social Musicianconnections    Talks on phone: Not on file    Gets together: Not on file    Attends religious service: Not on file    Active member of club or organization: Not on file    Attends meetings of clubs or organizations: Not on file    Relationship status: Not on file  . Intimate partner violence    Fear of current or ex partner: Not on file  Emotionally abused: Not on file    Physically abused: Not on file    Forced sexual activity: Not on file  Other Topics Concern  . Not on file  Social History Narrative   Has a PhD   3 children Born 2000, 2004, 2010   2 children live w/ her and her husband   Family History  Problem Relation Age of Onset  . Breast cancer Other        aunt  . CAD Other        GF, uncle  . Leukemia Mother 27  . Colon cancer Father 12  . Diabetes Neg Hx      Allergies as of 05/06/2019   No Known Allergies     Medication List       Accurate as of May 06, 2019 11:59 PM. If you have any questions, ask your nurse or doctor.        STOP taking these medications   cephALEXin 500 MG capsule Commonly known as: KEFLEX Stopped by: Kathlene November, MD   doxycycline 100 MG capsule Commonly known as: VIBRAMYCIN Stopped by: Kathlene November, MD      TAKE these medications   naproxen 500 MG tablet Commonly known as: NAPROSYN Take 1 tablet (500 mg total) by mouth 2 (two) times daily.   sertraline 50 MG tablet Commonly known as: ZOLOFT Take 1.5 tablets (75 mg total) by mouth daily. What changed: how much to take Changed by: Kathlene November, MD           Objective:   Physical Exam There were no vitals taken for this visit. This is a virtual video visit, the patient is alert oriented x3, no apparent distress.  She does seems to have a congested nose.    Assessment     Assessment Depression Allergies  Contraception: Condoms Perimenopausal as of 11-2017  Plan: Depression: On Zoloft 50 mg daily, symptoms controlled; few days a month she takes 75 mg a day for perimenopausal symptoms,  request to take 75 mg every day.  I agree, prescription sent. COVID-19: Patient spouse was diagnosed with COVID-19 , was  admitted, he is back home for the last 3 days, now Rita Lambert has symptoms and she was tested 2 days ago, results pending. We talk about the need for strict precautions, check her temperature twice a day, indications for ER visit.  She needs to keep herself hydrated. We also talk about the criteria to go back to work: 2 weeks after onset of illness, no fever and feeling better.  She might need a work note, will call me if needed. RTC in 2 to 3 weeks to check routine labs (because she is having COVID sxs) Next physical in 1 year    Today, I addition to her physical exam, I spent more than 20 minutes with the patient discussing other issues including depression COVID-19 exposures/symptoms.

## 2019-05-06 NOTE — Assessment & Plan Note (Addendum)
-   Td  04-02-16; encourage early flu shot; shingrix discussed - CCS: never had a cscope, failed [facility]OB referral last year; Patient  self order a cologuard; her risk is slt increased since her  father was dx w/ colon cancer at age 53, discussed option of cscope (wich I favor ) but she elected to proceed w/  Cologuard - Female Care : perimenopausal, saw gyn 2019; plans to schedule another visit, declined [facility]  Referral, states she will call - Diet, exercise: discussed , doing better   -Labs in 2 or 3 weeks: BMP, FLP, CBC, A1c (hyperglycemia)

## 2019-05-08 NOTE — Assessment & Plan Note (Signed)
Depression: On Zoloft 50 mg daily, symptoms controlled; few days a month she takes 75 mg a day for perimenopausal symptoms,  request to take 75 mg every day.  I agree, prescription sent. COVID-19: Patient spouse was diagnosed with COVID-19 , was  admitted, he is back home for the last 3 days, now Rita Lambert has symptoms and she was tested 2 days ago, results pending. We talk about the need for strict precautions, check her temperature twice a day, indications for ER visit.  She needs to keep herself hydrated. We also talk about the criteria to go back to work: 2 weeks after onset of illness, no fever and feeling better.  She might need a work note, will call me if needed. RTC in 2 to 3 weeks to check routine labs (because she is having COVID sxs) Next physical in 1 year

## 2019-05-09 ENCOUNTER — Telehealth: Payer: Self-pay

## 2019-05-09 NOTE — Telephone Encounter (Signed)
To answer the question I need to know: 1.  Was her Covid testing +? Negative? 2.  When did she stop having symptoms?  Still having symptoms?

## 2019-05-09 NOTE — Telephone Encounter (Signed)
Please advise 

## 2019-05-09 NOTE — Telephone Encounter (Signed)
Copied from [place] [phone]. Topic: General - Other >> May 09, 2019 11:07 AM Jodie Echevaria wrote: Reason for CRM: Patient called to request a note for her employer for when she can return to work asking for it to be available on My Chart. Ph# [phone]

## 2019-05-10 NOTE — Telephone Encounter (Signed)
LMOM informing Pt of below. Instructed to call back at her convenience.

## 2019-05-10 NOTE — Telephone Encounter (Signed)
Letter released to MyChart. MyChart message sent to Pt informing that she needs to be 3 days sx's free before returning to work.

## 2019-05-10 NOTE — Telephone Encounter (Signed)
Pt called back-COVID test was negative but she is still having symptoms- her work is requiring a note- okay to release to MyChart.

## 2019-05-10 NOTE — Telephone Encounter (Signed)
She will be okay to go back to work after 3 days without symptoms and without fever  (without Tylenol or ibuprofen). Please make a work note until next Monday.  If she is not well by then we can extend her excuse

## 2019-05-19 LAB — COLOGUARD: Cologuard: NEGATIVE

## 2019-08-05 ENCOUNTER — Other Ambulatory Visit: Payer: BC Managed Care – PPO

## 2019-08-16 ENCOUNTER — Other Ambulatory Visit (INDEPENDENT_AMBULATORY_CARE_PROVIDER_SITE_OTHER): Payer: BC Managed Care – PPO

## 2019-08-16 ENCOUNTER — Other Ambulatory Visit: Payer: Self-pay

## 2019-08-16 DIAGNOSIS — Z0000 Encounter for general adult medical examination without abnormal findings: Secondary | ICD-10-CM

## 2019-08-16 DIAGNOSIS — R739 Hyperglycemia, unspecified: Secondary | ICD-10-CM

## 2019-08-16 LAB — CBC WITH DIFFERENTIAL/PLATELET
Basophils Absolute: 0 10*3/uL (ref 0.0–0.1)
Basophils Relative: 0.4 % (ref 0.0–3.0)
Eosinophils Absolute: 0.2 10*3/uL (ref 0.0–0.7)
Eosinophils Relative: 3.2 % (ref 0.0–5.0)
HCT: 36.9 % (ref 36.0–46.0)
Hemoglobin: 12.4 g/dL (ref 12.0–15.0)
Lymphocytes Relative: 18.6 % (ref 12.0–46.0)
Lymphs Abs: 1.4 10*3/uL (ref 0.7–4.0)
MCHC: 33.5 g/dL (ref 30.0–36.0)
MCV: 88.4 fl (ref 78.0–100.0)
Monocytes Absolute: 0.4 10*3/uL (ref 0.1–1.0)
Monocytes Relative: 5.6 % (ref 3.0–12.0)
Neutro Abs: 5.4 10*3/uL (ref 1.4–7.7)
Neutrophils Relative %: 72.2 % (ref 43.0–77.0)
Platelets: 239 10*3/uL (ref 150.0–400.0)
RBC: 4.18 Mil/uL (ref 3.87–5.11)
RDW: 13.3 % (ref 11.5–15.5)
WBC: 7.4 10*3/uL (ref 4.0–10.5)

## 2019-08-16 LAB — BASIC METABOLIC PANEL
BUN: 15 mg/dL (ref 6–23)
CO2: 27 mEq/L (ref 19–32)
Calcium: 8.8 mg/dL (ref 8.4–10.5)
Chloride: 105 mEq/L (ref 96–112)
Creatinine, Ser: 0.83 mg/dL (ref 0.40–1.20)
GFR: 72.18 mL/min (ref >60.00)
Glucose, Bld: 100 mg/dL — ABNORMAL HIGH (ref 70–99)
Potassium: 4 mEq/L (ref 3.5–5.1)
Sodium: 139 mEq/L (ref 135–145)

## 2019-08-16 LAB — LIPID PANEL
Cholesterol: 172 mg/dL (ref 0–200)
HDL: 59.2 mg/dL (ref >39.00)
LDL Cholesterol: 103 mg/dL — ABNORMAL HIGH (ref 0–99)
NonHDL: 112.93
Total CHOL/HDL Ratio: 3
Triglycerides: 48 mg/dL (ref 0.0–149.0)
VLDL: 9.6 mg/dL (ref 0.0–40.0)

## 2019-08-16 LAB — HEMOGLOBIN A1C: Hgb A1c MFr Bld: 5.6 % (ref 4.6–6.5)

## 2019-09-16 DIAGNOSIS — Z1231 Encounter for screening mammogram for malignant neoplasm of breast: Secondary | ICD-10-CM | POA: Diagnosis not present

## 2019-09-16 DIAGNOSIS — N6489 Other specified disorders of breast: Secondary | ICD-10-CM | POA: Diagnosis not present

## 2019-09-16 LAB — HM MAMMOGRAPHY

## 2019-11-07 DIAGNOSIS — R928 Other abnormal and inconclusive findings on diagnostic imaging of breast: Secondary | ICD-10-CM | POA: Diagnosis not present

## 2019-11-07 DIAGNOSIS — R922 Inconclusive mammogram: Secondary | ICD-10-CM | POA: Diagnosis not present

## 2019-11-07 DIAGNOSIS — N6325 Unspecified lump in the left breast, overlapping quadrants: Secondary | ICD-10-CM | POA: Diagnosis not present

## 2019-11-07 DIAGNOSIS — N6320 Unspecified lump in the left breast, unspecified quadrant: Secondary | ICD-10-CM | POA: Diagnosis not present

## 2019-12-16 DIAGNOSIS — N6032 Fibrosclerosis of left breast: Secondary | ICD-10-CM | POA: Diagnosis not present

## 2019-12-16 DIAGNOSIS — N6012 Diffuse cystic mastopathy of left breast: Secondary | ICD-10-CM | POA: Diagnosis not present

## 2019-12-16 DIAGNOSIS — N6321 Unspecified lump in the left breast, upper outer quadrant: Secondary | ICD-10-CM | POA: Diagnosis not present

## 2019-12-16 DIAGNOSIS — N6489 Other specified disorders of breast: Secondary | ICD-10-CM | POA: Diagnosis not present

## 2019-12-16 LAB — HM MAMMOGRAPHY

## 2020-01-22 ENCOUNTER — Other Ambulatory Visit: Payer: Self-pay | Admitting: Internal Medicine

## 2020-01-23 ENCOUNTER — Telehealth: Payer: Self-pay | Admitting: *Deleted

## 2020-01-23 MED ORDER — SERTRALINE HCL 50 MG PO TABS: 75.0000 mg | ORAL_TABLET | Freq: Every day | ORAL | 0 refills | Status: DC

## 2020-01-23 NOTE — Telephone Encounter (Signed)
Tried calling Pt to see which pharmacy in [facility] she wants sertraline sent to. Mailbox full- unable to leave message.

## 2020-01-23 NOTE — Telephone Encounter (Signed)
Received fax from [facility] in [facility]. Rx sent.

## 2020-01-23 NOTE — Telephone Encounter (Signed)
Call Type Triage / Clinical Relationship To Patient Self Return Phone Number [phone] (Primary) Chief Complaint Prescription Refill or Medication Request (non symptomatic) Reason for Call Symptomatic / Request for Health Information Initial Comment Is in [place] and forgot her sertraline. Pharmacy there will not give her a refill because it expires tomorrow. Says they need a new rx called in. Translation No Nurse Assessment Nurse: Odis Luster, RN, Bjorn Loser Date/Time (Eastern Time): 01/21/2020 12:39:18 PM Confirm and document reason for call. If symptomatic, describe symptoms. ---Is in [place] and forgot her sertraline. Pharmacy there will not give her a refill because it expires tomorrow. Says they need a new rx called in. She states that she will be coming home in the next day or 2.   Disp. Time Lamount Cohen Time) Disposition Final User Reason: [phone][facility]: confirmed Sertraline 75mg  daily script with pharmacy. (25mg  tabs x 3 daily)

## 2020-01-25 ENCOUNTER — Telehealth: Payer: Self-pay | Admitting: Psychiatry

## 2020-01-25 NOTE — Telephone Encounter (Signed)
Error

## 2020-03-08 ENCOUNTER — Encounter: Payer: Self-pay | Admitting: Internal Medicine

## 2020-04-02 ENCOUNTER — Ambulatory Visit (INDEPENDENT_AMBULATORY_CARE_PROVIDER_SITE_OTHER): Payer: BC Managed Care – PPO | Admitting: Family Medicine

## 2020-04-02 ENCOUNTER — Other Ambulatory Visit: Payer: Self-pay

## 2020-04-02 VITALS — BP 128/82 | Ht 71.0 in | Wt 230.0 lb

## 2020-04-02 DIAGNOSIS — M5010 Cervical disc disorder with radiculopathy, unspecified cervical region: Secondary | ICD-10-CM

## 2020-04-02 MED ORDER — PREDNISONE 10 MG PO TABS: ORAL_TABLET | ORAL | 0 refills | Status: DC

## 2020-04-02 MED ORDER — METHOCARBAMOL 500 MG PO TABS: 500.0000 mg | ORAL_TABLET | Freq: Three times a day (TID) | ORAL | 1 refills | Status: DC | PRN

## 2020-04-02 NOTE — Patient Instructions (Signed)
You have cervical radiculopathy (a pinched nerve in the neck). Prednisone 6 day dose pack to relieve irritation/inflammation of the nerve. Robaxin three times a day as needed for muscle spasms (do not drive with this if it makes you sleepy). Consider cervical collar if severely painful. Simple range of motion exercises within limits of pain to prevent further stiffness. Consider physical therapy for stretching, exercises, traction, and modalities if you're improving but still have some pain/burning. Heat 15 minutes at a time 3-4 times a day to help with spasms. Watch head position when on computers, texting, when sleeping in bed - should in line with back to prevent further nerve traction and irritation. If not improving we will consider an MRI. Let me know how you're doing in 1 week even if you're doing better.

## 2020-04-03 ENCOUNTER — Encounter: Payer: Self-pay | Admitting: Family Medicine

## 2020-04-03 NOTE — Progress Notes (Signed)
PCP: Colon Branch, MD  Subjective:   HPI: Patient is a 54 y.o. female here for right arm numbness.  Patient reports for about 1 month she's had pain, numbness right side of neck into right arm. Usually radiating to right thumb and index finger but has had some going into middle and ring finger also. Feels more when she turns her head to the right. No feeling of weakness. No bowel/bladder dysfunction. Works 19 hour shifts. Right handed.  Past Medical History:  Diagnosis Date  . Allergy   . Contraception    condoms  . Depression     Current Outpatient Medications on File Prior to Visit  Medication Sig Dispense Refill  . sertraline (ZOLOFT) 50 MG tablet Take 1.5 tablets (75 mg total) by mouth daily. 135 tablet 0   No current facility-administered medications on file prior to visit.    Past Surgical History:  Procedure Laterality Date  . MOUTH SURGERY     teeth removal, freniolectomy  . VAGINAL DELIVERY     2000, 2004, and 2010    No Known Allergies  Social History   Socioeconomic History  . Marital status: Married    Spouse name: Not on file  . Number of children: 3  . Years of education: Not on file  . Highest education level: Not on file  Occupational History  . Occupation: acute care psych inpatient and HH--RN, has a PhD  Tobacco Use  . Smoking status: Never Smoker  . Smokeless tobacco: Never Used  Substance and Sexual Activity  . Alcohol use: Yes    Alcohol/week: 0.0 standard drinks    Comment: Social  . Drug use: No  . Sexual activity: Not on file  Other Topics Concern  . Not on file  Social History Narrative   Has a PhD   3 children Born 2000, 2004, 2010   2 children live w/ her and her husband   Social Determinants of Radio broadcast assistant Strain:   . Difficulty of Paying Living Expenses:   Food Insecurity:   . Worried About Charity fundraiser in the Last Year:   . Arboriculturist in the Last Year:   Transportation Needs:   . Lexicographer (Medical):   Marland Kitchen Lack of Transportation (Non-Medical):   Physical Activity:   . Days of Exercise per Week:   . Minutes of Exercise per Session:   Stress:   . Feeling of Stress :   Social Connections:   . Frequency of Communication with Friends and Family:   . Frequency of Social Gatherings with Friends and Family:   . Attends Religious Services:   . Active Member of Clubs or Organizations:   . Attends Archivist Meetings:   Marland Kitchen Marital Status:   Intimate Partner Violence:   . Fear of Current or Ex-Partner:   . Emotionally Abused:   Marland Kitchen Physically Abused:   . Sexually Abused:     Family History  Problem Relation Age of Onset  . Breast cancer Other        aunt  . CAD Other        GF, uncle  . Leukemia Mother 48  . Colon cancer Father 68  . Diabetes Neg Hx     BP 128/82   Ht 5\' 11"  (1.803 m)   Wt 230 lb (104.3 kg)   BMI 32.08 kg/m   Review of Systems: See HPI above.     Objective:  Physical Exam:  Gen: NAD, comfortable in exam room  Neck: No gross deformity, swelling, bruising. TTP right trapezius.  No midline/bony TTP. FROM. BUE strength 5/5.   Sensation intact to light touch currently.   Trace right brachioradialis reflex.  2+ equal reflexes in triceps, biceps, left brachioradialis tendons. Positive spurlings.   Assessment & Plan:  1. Right cervical radiculopathy - consistent with C6 distribution, some symptoms into C7.  Start with prednisone dose pack with robaxin as needed.  Heat, ergonomic considerations.  Let us know how she's doing in 1 week.  Consider MRI if not improving, physical therapy if improving but not completely.

## 2020-04-10 ENCOUNTER — Encounter: Payer: BC Managed Care – PPO | Admitting: Internal Medicine

## 2020-04-15 ENCOUNTER — Encounter (HOSPITAL_BASED_OUTPATIENT_CLINIC_OR_DEPARTMENT_OTHER): Payer: Self-pay | Admitting: Emergency Medicine

## 2020-04-15 ENCOUNTER — Other Ambulatory Visit: Payer: Self-pay

## 2020-04-15 ENCOUNTER — Emergency Department (HOSPITAL_BASED_OUTPATIENT_CLINIC_OR_DEPARTMENT_OTHER): Payer: BC Managed Care – PPO

## 2020-04-15 ENCOUNTER — Emergency Department (HOSPITAL_BASED_OUTPATIENT_CLINIC_OR_DEPARTMENT_OTHER)
Admission: EM | Admit: 2020-04-15 | Discharge: 2020-04-15 | Disposition: A | Discharge status: Home / Self Care | Payer: BC Managed Care – PPO | Attending: Emergency Medicine | Admitting: Emergency Medicine

## 2020-04-15 DIAGNOSIS — R079 Chest pain, unspecified: Secondary | ICD-10-CM | POA: Diagnosis not present

## 2020-04-15 DIAGNOSIS — J181 Lobar pneumonia, unspecified organism: Secondary | ICD-10-CM | POA: Diagnosis not present

## 2020-04-15 DIAGNOSIS — J939 Pneumothorax, unspecified: Secondary | ICD-10-CM | POA: Diagnosis not present

## 2020-04-15 DIAGNOSIS — R0602 Shortness of breath: Secondary | ICD-10-CM | POA: Insufficient documentation

## 2020-04-15 DIAGNOSIS — Z20822 Contact with and (suspected) exposure to covid-19: Secondary | ICD-10-CM | POA: Diagnosis not present

## 2020-04-15 DIAGNOSIS — R0600 Dyspnea, unspecified: Secondary | ICD-10-CM | POA: Diagnosis not present

## 2020-04-15 DIAGNOSIS — R0789 Other chest pain: Secondary | ICD-10-CM | POA: Insufficient documentation

## 2020-04-15 DIAGNOSIS — Z79899 Other long term (current) drug therapy: Secondary | ICD-10-CM | POA: Diagnosis not present

## 2020-04-15 DIAGNOSIS — R000 Tachycardia, unspecified: Secondary | ICD-10-CM | POA: Diagnosis not present

## 2020-04-15 DIAGNOSIS — J810 Acute pulmonary edema: Secondary | ICD-10-CM | POA: Diagnosis not present

## 2020-04-15 LAB — CBC WITH DIFFERENTIAL/PLATELET
Abs Immature Granulocytes: 0.28 10*3/uL — ABNORMAL HIGH (ref 0.00–0.07)
Basophils Absolute: 0.1 10*3/uL (ref 0.0–0.1)
Basophils Relative: 0 %
Eosinophils Absolute: 0.3 10*3/uL (ref 0.0–0.5)
Eosinophils Relative: 2 %
HCT: 39.8 % (ref 36.0–46.0)
Hemoglobin: 13.1 g/dL (ref 12.0–15.0)
Immature Granulocytes: 2 %
Lymphocytes Relative: 11 %
Lymphs Abs: 1.8 10*3/uL (ref 0.7–4.0)
MCH: 28.5 pg (ref 26.0–34.0)
MCHC: 32.9 g/dL (ref 30.0–36.0)
MCV: 86.7 fL (ref 80.0–100.0)
Monocytes Absolute: 1 10*3/uL (ref 0.1–1.0)
Monocytes Relative: 6 %
Neutro Abs: 12.6 10*3/uL — ABNORMAL HIGH (ref 1.7–7.7)
Neutrophils Relative %: 79 %
Platelets: 289 10*3/uL (ref 150–400)
RBC: 4.59 MIL/uL (ref 3.87–5.11)
RDW: 13.4 % (ref 11.5–15.5)
WBC: 16.1 10*3/uL — ABNORMAL HIGH (ref 4.0–10.5)
nRBC: 0 % (ref 0.0–0.2)

## 2020-04-15 LAB — BASIC METABOLIC PANEL
Anion gap: 11 (ref 5–15)
BUN: 14 mg/dL (ref 6–20)
CO2: 20 mmol/L — ABNORMAL LOW (ref 22–32)
Calcium: 9.1 mg/dL (ref 8.9–10.3)
Chloride: 106 mmol/L (ref 98–111)
Creatinine, Ser: 1.04 mg/dL — ABNORMAL HIGH (ref 0.44–1.00)
GFR calc Af Amer: >60 mL/min (ref >60)
GFR calc non Af Amer: >60 mL/min (ref >60)
Glucose, Bld: 148 mg/dL — ABNORMAL HIGH (ref 70–99)
Potassium: 3.3 mmol/L — ABNORMAL LOW (ref 3.5–5.1)
Sodium: 137 mmol/L (ref 135–145)

## 2020-04-15 LAB — D-DIMER, QUANTITATIVE: D-Dimer, Quant: 0.48 ug/mL-FEU (ref 0.00–0.50)

## 2020-04-15 LAB — TROPONIN I (HIGH SENSITIVITY)
Troponin I (High Sensitivity): 4 ng/L (ref <18)
Troponin I (High Sensitivity): 4 ng/L (ref <18)

## 2020-04-15 NOTE — ED Notes (Signed)
Pt given ginger ale and crackers ok per dr Rubin Payor

## 2020-04-15 NOTE — ED Provider Notes (Signed)
[facility] EMERGENCY DEPARTMENT Provider Note   CSN: [id number] Arrival date & time: 04/15/20  1136     History Chief Complaint  Patient presents with  . Chest Pain    Rita Lambert is a 54 y.o. female.  HPI Patient presents with chest pain shortness of breath fatigue peers had for last few days.  States she has episodes where she gets chest tightness and feels her heart race.  States she gets anxious with these episodes to.  States she feels pressure in the mid chest.  States last few minutes.  Come on and goes away on its own.  Does not come on with exertion.  Although states that when it comes on she feels more short of breath.  States she does sometimes feel short of breath with walking but cannot make the pain or the heart racing, necessarily.  No fevers.  No cough.  No swelling in her legs.    Past Medical History:  Diagnosis Date  . Allergy   . Contraception    condoms  . Depression     Patient Active Problem List   Diagnosis Date Noted  . Left shoulder pain 04/10/2016  . PCP NOTES >>>>>>>>>>>>>>>> 04/09/2016  . Annual physical exam 04/02/2016  . Depression 06/26/2015    Past Surgical History:  Procedure Laterality Date  . MOUTH SURGERY     teeth removal, freniolectomy  . VAGINAL DELIVERY     2000, 2004, and 2010     OB History   No obstetric history on file.     Family History  Problem Relation Age of Onset  . Breast cancer Other        aunt  . CAD Other        GF, uncle  . Leukemia Mother 60  . Colon cancer Father 72  . Diabetes Neg Hx     Social History   Tobacco Use  . Smoking status: Never Smoker  . Smokeless tobacco: Never Used  Substance Use Topics  . Alcohol use: Yes    Alcohol/week: 0.0 standard drinks    Comment: Social  . Drug use: No    Home Medications Prior to Admission medications   Medication Sig Start Date End Date Taking? Authorizing Provider  methocarbamol (ROBAXIN) 500 MG tablet Take 1 tablet (500 mg  total) by mouth every 8 (eight) hours as needed. 04/02/20   Hudnall, Azucena Fallen, MD  predniSONE (DELTASONE) 10 MG tablet 6 tabs po day 1, 5 tabs po day 2, 4 tabs po day 3, 3 tabs po day 4, 2 tabs po day 5, 1 tab po day 6 04/02/20   Hudnall, Azucena Fallen, MD  sertraline (ZOLOFT) 50 MG tablet Take 1.5 tablets (75 mg total) by mouth daily. 01/23/20   Wanda Plump, MD    Allergies    Patient has no known allergies.  Review of Systems   Review of Systems  Constitutional: Positive for fatigue. Negative for appetite change and fever.  HENT: Negative for congestion.   Respiratory: Positive for shortness of breath.   Cardiovascular: Positive for chest pain. Negative for leg swelling.  Gastrointestinal: Negative for abdominal pain.  Genitourinary: Negative for flank pain.  Musculoskeletal: Negative for back pain.  Skin: Negative for rash.  Neurological: Negative for weakness.  Psychiatric/Behavioral: The patient is nervous/anxious.     Physical Exam Updated Vital Signs BP (!) 148/99 (BP Location: Right Arm)   Pulse 99   Temp 99.5 F (37.5 C) (Oral)  Resp 15   Ht 5\' 11"  (1.803 m)   Wt 102.8 kg   SpO2 100%   BMI 31.61 kg/m   Physical Exam Vitals and nursing note reviewed.  HENT:     Head: Normocephalic.  Cardiovascular:     Rate and Rhythm: Tachycardia present.     Heart sounds: No murmur.  Pulmonary:     Effort: Pulmonary effort is normal.     Breath sounds: No wheezing, rhonchi or rales.  Chest:     Chest wall: No tenderness.  Abdominal:     Tenderness: There is no abdominal tenderness.  Musculoskeletal:     Cervical back: Neck supple.     Right lower leg: No edema.     Left lower leg: No edema.  Skin:    General: Skin is warm.  Neurological:     Mental Status: She is alert and oriented to person, place, and time.  Psychiatric:     Comments: Patient somewhat tearful.     ED Results / Procedures / Treatments   Labs (all labs ordered are listed, but only abnormal results are  displayed) Labs Reviewed  CBC WITH DIFFERENTIAL/PLATELET - Abnormal; Notable for the following components:      Result Value   WBC 16.1 (*)    Neutro Abs 12.6 (*)    Abs Immature Granulocytes 0.28 (*)    All other components within normal limits  BASIC METABOLIC PANEL - Abnormal; Notable for the following components:   Potassium 3.3 (*)    CO2 20 (*)    Glucose, Bld 148 (*)    Creatinine, Ser 1.04 (*)    All other components within normal limits  SARS CORONAVIRUS 2 BY RT PCR (HOSPITAL ORDER, [facility])  D-DIMER, QUANTITATIVE (NOT AT [facility])  TROPONIN I (HIGH SENSITIVITY)  TROPONIN I (HIGH SENSITIVITY)    EKG EKG Interpretation  Date/Time:  Sunday April 15 2020 11:49:52 EDT Ventricular Rate:  116 PR Interval:    QRS Duration: 101 QT Interval:  333 QTC Calculation: 463 R Axis:   6 Text Interpretation: Sinus tachycardia Confirmed by Davonna Belling [phone]) on 04/15/2020 12:25:02 PM   Radiology DG Chest Portable 1 View  Result Date: 04/15/2020 CLINICAL DATA:  Chest pain. EXAM: PORTABLE CHEST 1 VIEW COMPARISON:  No pertinent prior studies available for comparison. FINDINGS: The patient is rotated to the right. Heart size within normal limits. No evidence of airspace consolidation within the lungs. No frank pulmonary edema. No evidence of pleural effusion or pneumothorax. No acute bony abnormality identified. IMPRESSION: No evidence of acute cardiopulmonary abnormality. Electronically Signed   By: Kellie Simmering DO   On: 04/15/2020 12:49    Procedures Procedures (including critical care time)  Medications Ordered in ED Medications - No data to display  ED Course  I have reviewed the triage vital signs and the nursing notes.  Pertinent labs & imaging results that were available during my care of the patient were reviewed by me and considered in my medical decision making (see chart for details).    MDM Rules/Calculators/A&P                       Patient with chest pain.  Fatigue.  Anterior chest pressure x2.  Mild leukocytosis.  White count elevated.  Heart rate improved.  D-dimer negative.  Troponin negative x2.  Heart score of 4.  X-ray does not show pneumonia.  Covid test done but is not resulted yet.  Outpatient follow-up with PCP. Final Clinical Impression(s) / ED Diagnoses Final diagnoses:  Nonspecific chest pain  Dyspnea, unspecified type    Rx / DC Orders ED Discharge Orders    None       Benjiman Core, MD 04/15/20 1449

## 2020-04-15 NOTE — ED Triage Notes (Signed)
Chest pressure with SOB, fatigue x several days.

## 2020-04-24 ENCOUNTER — Ambulatory Visit (INDEPENDENT_AMBULATORY_CARE_PROVIDER_SITE_OTHER): Payer: BC Managed Care – PPO | Admitting: Internal Medicine

## 2020-04-24 ENCOUNTER — Other Ambulatory Visit: Payer: Self-pay

## 2020-04-24 ENCOUNTER — Encounter: Payer: Self-pay | Admitting: Internal Medicine

## 2020-04-24 VITALS — BP 136/84 | HR 86 | Temp 97.7°F | Resp 18 | Ht 71.0 in | Wt 233.5 lb

## 2020-04-24 DIAGNOSIS — R002 Palpitations: Secondary | ICD-10-CM | POA: Diagnosis not present

## 2020-04-24 DIAGNOSIS — F32A Depression, unspecified: Secondary | ICD-10-CM

## 2020-04-24 DIAGNOSIS — F419 Anxiety disorder, unspecified: Secondary | ICD-10-CM | POA: Diagnosis not present

## 2020-04-24 DIAGNOSIS — F329 Major depressive disorder, single episode, unspecified: Secondary | ICD-10-CM

## 2020-04-24 DIAGNOSIS — Z0000 Encounter for general adult medical examination without abnormal findings: Secondary | ICD-10-CM | POA: Diagnosis not present

## 2020-04-24 DIAGNOSIS — E010 Iodine-deficiency related diffuse (endemic) goiter: Secondary | ICD-10-CM | POA: Diagnosis not present

## 2020-04-24 MED ORDER — SERTRALINE HCL 100 MG PO TABS
100.0000 mg | ORAL_TABLET | Freq: Every day | ORAL | 1 refills | Status: DC
Start: 2020-04-24 — End: 2020-10-06

## 2020-04-24 NOTE — Progress Notes (Signed)
Subjective:    Patient ID: Rita Lambert, female    DOB: 1966-09-09, 54 y.o.   MRN: [id number]  DOS:  04/24/2020 Type of visit - description: CPX Went to the ER: Symptoms of started 2 weeks prior to the ER evaluation, reported DOE, palpitations, symptoms were random, associated with some chest tightness. ER records reviewed, EKG: Sinus tachycardia Admits to a lot of stress, multiple stress triggers.   Review of Systems Denies nausea, vomiting, diarrhea or blood in the stools Sleeps okay most nights No recent prolonged car trips or airplane trips. No calf pain or calf swelling.  Other than above, a 14 point review of systems is negative     Past Medical History:  Diagnosis Date  . Allergy   . Contraception    condoms  . Depression     Past Surgical History:  Procedure Laterality Date  . BREAST BIOPSY Left ~  12/2019   bx neg   . MOUTH SURGERY     teeth removal, freniolectomy  . VAGINAL DELIVERY     2000, 2004, and 2010   Family History  Problem Relation Age of Onset  . Breast cancer Other        aunt  . CAD Other        GF, uncle  . Leukemia Mother 55  . Colon cancer Father 71  . Diabetes Neg Hx      Allergies as of 04/24/2020   No Known Allergies     Medication List       Accurate as of April 24, 2020 11:59 PM. If you have any questions, ask your nurse or doctor.        STOP taking these medications   methocarbamol 500 MG tablet Commonly known as: Robaxin Stopped by: Willow Ora, MD   predniSONE 10 MG tablet Commonly known as: DELTASONE Stopped by: Willow Ora, MD     TAKE these medications   sertraline 100 MG tablet Commonly known as: ZOLOFT Take 1 tablet (100 mg total) by mouth daily. What changed:   medication strength  how much to take Changed by: Willow Ora, MD          Objective:   Physical Exam BP 136/84 (BP Location: Left Arm, Patient Position: Sitting, Cuff Size: Normal)   Pulse 86   Temp 97.7 F (36.5 C) (Temporal)   Resp  18   Ht 5\' 11"  (1.803 m)   Wt 233 lb 8 oz (105.9 kg)   SpO2 96%   BMI 32.57 kg/m  General: Well developed, NAD, BMI noted Neck: No  thyromegaly  HEENT:  Normocephalic . Face symmetric, atraumatic. Question of enlarged right-sided thyroid gland. Lungs:  CTA B Normal respiratory effort, no intercostal retractions, no accessory muscle use. Heart: RRR,  no murmur.  Abdomen:  Not distended, soft, non-tender. No rebound or rigidity.   Lower extremities: no pretibial edema bilaterally  Skin: Exposed areas without rash. Not pale. Not jaundice Neurologic:  alert & oriented X3.  Speech normal, gait appropriate for age and unassisted Strength symmetric and appropriate for age.  Psych: Cognition and judgment appear intact.  Cooperative with normal attention span and concentration.  Behavior appropriate. Slightly anxious but not depressed appearing.     Assessment     Assessment Depression Allergies  Contraception: Condoms Perimenopausal as of 11-2017  Plan: Here for CPX Chest pain, DOE palpitations: Went to the ER 04/15/2020 Labs: Potassium 3.3, D-dimer negative, CBC normal except for a white count of 16.1, troponin  negative, creatinine 1.0 Chest x-ray nonacute She thinks this could be anxiety related but we agreed that she needs further evaluation. Plan: Refer to cardiology, call or ER if symptoms severe. Depression: Now also with a lot of stress, some anxiety, advise about yoga, meditation, counseling. Also agreed to increase sertraline to 100 mg.  If depression gets better then simply come back in 1 year otherwise come back sooner. Thyromegaly?  See physical exam, check ultrasound RTC 1 year    This visit occurred during the SARS-CoV-2 public health emergency.  Safety protocols were in place, including screening questions prior to the visit, additional usage of staff PPE, and extensive cleaning of exam room while observing appropriate contact time as indicated for  disinfecting solutions.

## 2020-04-24 NOTE — Progress Notes (Signed)
Pre visit review using our clinic review tool, if applicable. No additional management support is needed unless otherwise documented below in the visit note. 

## 2020-04-24 NOTE — Patient Instructions (Addendum)
Increase sertraline to 100 mg daily, we sent a prescription  We are referring you to the cardiologist for palpitations.  If he has severe symptoms: Call or go to the ER  GO TO THE FRONT DESK, PLEASE SCHEDULE YOUR APPOINTMENTS Come back for for a physical exam in 1 year, sooner if needed

## 2020-04-25 LAB — CBC WITH DIFFERENTIAL/PLATELET
Basophils Absolute: 0.1 10*3/uL (ref 0.0–0.1)
Basophils Relative: 1 % (ref 0.0–3.0)
Eosinophils Absolute: 0.2 10*3/uL (ref 0.0–0.7)
Eosinophils Relative: 2.4 % (ref 0.0–5.0)
HCT: 36.1 % (ref 36.0–46.0)
Hemoglobin: 12.3 g/dL (ref 12.0–15.0)
Lymphocytes Relative: 16.1 % (ref 12.0–46.0)
Lymphs Abs: 1.4 10*3/uL (ref 0.7–4.0)
MCHC: 34.1 g/dL (ref 30.0–36.0)
MCV: 85.6 fl (ref 78.0–100.0)
Monocytes Absolute: 0.5 10*3/uL (ref 0.1–1.0)
Monocytes Relative: 5.4 % (ref 3.0–12.0)
Neutro Abs: 6.4 10*3/uL (ref 1.4–7.7)
Neutrophils Relative %: 75.1 % (ref 43.0–77.0)
Platelets: 234 10*3/uL (ref 150.0–400.0)
RBC: 4.21 Mil/uL (ref 3.87–5.11)
RDW: 13.9 % (ref 11.5–15.5)
WBC: 8.6 10*3/uL (ref 4.0–10.5)

## 2020-04-25 LAB — COMPREHENSIVE METABOLIC PANEL
ALT: 7 U/L (ref 0–35)
AST: 12 U/L (ref 0–37)
Albumin: 4.2 g/dL (ref 3.5–5.2)
Alkaline Phosphatase: 60 U/L (ref 39–117)
BUN: 14 mg/dL (ref 6–23)
CO2: 26 mEq/L (ref 19–32)
Calcium: 9 mg/dL (ref 8.4–10.5)
Chloride: 105 mEq/L (ref 96–112)
Creatinine, Ser: 0.93 mg/dL (ref 0.40–1.20)
GFR: 63.13 mL/min (ref >60.00)
Glucose, Bld: 106 mg/dL — ABNORMAL HIGH (ref 70–99)
Potassium: 3.5 mEq/L (ref 3.5–5.1)
Sodium: 138 mEq/L (ref 135–145)
Total Bilirubin: 0.5 mg/dL (ref 0.2–1.2)
Total Protein: 6.7 g/dL (ref 6.0–8.3)

## 2020-04-25 LAB — LIPID PANEL
Cholesterol: 199 mg/dL (ref 0–200)
HDL: 69 mg/dL (ref >39.00)
LDL Cholesterol: 114 mg/dL — ABNORMAL HIGH (ref 0–99)
NonHDL: 129.89
Total CHOL/HDL Ratio: 3
Triglycerides: 77 mg/dL (ref 0.0–149.0)
VLDL: 15.4 mg/dL (ref 0.0–40.0)

## 2020-04-25 LAB — TSH: TSH: 1.01 u[IU]/mL (ref 0.35–4.50)

## 2020-04-26 NOTE — Assessment & Plan Note (Signed)
-   Td  04-02-16 - shingrix discussed before - had Covid vaccinations - CCS: never had a cscope, cologuard  05-2019: Negative -  Female Care : perimenopausal (still periods) , due to seen gyn (plans for July),  MMG 09/2019 - Diet, exercise: Not doing well lately, consulate  -Labs: CMP, CBC, FLP, TSH

## 2020-04-26 NOTE — Assessment & Plan Note (Signed)
Here for CPX Chest pain, DOE palpitations: Went to the ER 04/15/2020 Labs: Potassium 3.3, D-dimer negative, CBC normal except for a white count of 16.1, troponin negative, creatinine 1.0 Chest x-ray nonacute She thinks this could be anxiety related but we agreed that she needs further evaluation. Plan: Refer to cardiology, call or ER if symptoms severe. Depression: Now also with a lot of stress, some anxiety, advise about yoga, meditation, counseling. Also agreed to increase sertraline to 100 mg.  If depression gets better then simply come back in 1 year otherwise come back sooner. Thyromegaly?  See physical exam, check ultrasound RTC 1 year

## 2020-05-02 ENCOUNTER — Encounter: Payer: Self-pay | Admitting: Internal Medicine

## 2020-05-04 ENCOUNTER — Other Ambulatory Visit: Payer: Self-pay

## 2020-05-04 ENCOUNTER — Other Ambulatory Visit: Payer: Self-pay | Admitting: Internal Medicine

## 2020-05-04 ENCOUNTER — Ambulatory Visit (HOSPITAL_BASED_OUTPATIENT_CLINIC_OR_DEPARTMENT_OTHER)
Admission: RE | Admit: 2020-05-04 | Discharge: 2020-05-04 | Disposition: A | Discharge status: Home / Self Care | Payer: BC Managed Care – PPO | Source: Ambulatory Visit | Attending: Internal Medicine | Admitting: Internal Medicine

## 2020-05-04 DIAGNOSIS — E010 Iodine-deficiency related diffuse (endemic) goiter: Secondary | ICD-10-CM | POA: Insufficient documentation

## 2020-05-04 DIAGNOSIS — E042 Nontoxic multinodular goiter: Secondary | ICD-10-CM | POA: Diagnosis not present

## 2020-05-19 NOTE — Progress Notes (Signed)
Cardiology Office Note:    Date:  05/21/2020   ID:  Rita Lambert, DOB [birth year], MRN [id number]  PCP:  Wanda Plump, MD  Cardiologist:  Norman Herrlich, MD   Referring MD: Wanda Plump, MD  ASSESSMENT:    1. Palpitations   2. Chest pain of uncertain etiology   3. Hypokalemia    PLAN:    In order of problems listed above:  1. Her symptoms sound most like sinus tachycardia however at risk for atrial arrhythmia and will utilize a 7-day ZIO monitor to screen.  If normal I will see back in the office as needed 2. Evaluated in the emergency room no evidence of acute coronary syndrome symptoms are nonanginal and after discussion with patient we will not pursue an ischemia evaluation at this time 3. Hypokalemia may play a role she will start taking over-the-counter potassium supplement.  Next appointment as needed   Medication Adjustments/Labs and Tests Ordered: Current medicines are reviewed at length with the patient today.  Concerns regarding medicines are outlined above.  Orders Placed This Encounter  Procedures  . LONG TERM MONITOR (3-14 DAYS)   No orders of the defined types were placed in this encounter.    Chief Complaint  Patient presents with  . Palpitations    History of Present Illness:    Rita Lambert is a 54 y.o. female who is being seen today for the evaluation of palpitation at the request of Wanda Plump, MD.  Chart review reveals recent labs normal TSH 04/24/2020 potassium borderline low 3.51 normal potassium previously diminished at 3.3 and normal CBC.  Her lipid profile shows a cholesterol 199 triglyceride 77 HDL 69 LDL 114.  She was seen in the emergency room 04/15/2020 with complaints of heart racing and chest discomfort.  Her EKG that day independently reviewed that EKG which shows sinus tachycardia rate of 116 bpm otherwise normal. Her high-sensitivity troponin was not elevated and D-dimer was not elevated.  I independently reviewed her chest x-ray  from that day which was normal.   1 mo ago  (04/15/20) 1 mo ago  (04/15/20)   Troponin I (High Sensitivity) <18 ng/L 4  4 CM   She has a long history of palpitation never severe sustained or frequent.  Recently she had an episode that was different her heart rate was in the range of 140 bpm she felt quite weak the episode would not stop and a prompted her to be seen in the emergency room.  In the emergency room her heart rate was 89 bpm.  He said once in a while she has brief momentary nonanginal chest pain.  She relates that she has a great deal of stress and the episode was precipitated by severe episode.  No shortness of breath edema or syncope.  She does not use over-the-counter proarrhythmic drugs. Past Medical History:  Diagnosis Date  . Allergy   . Contraception    condoms  . Depression     Past Surgical History:  Procedure Laterality Date  . BREAST BIOPSY Left ~  12/2019   bx neg   . MOUTH SURGERY     teeth removal, freniolectomy  . VAGINAL DELIVERY     2000, 2004, and 2010    Current Medications: Current Meds  Medication Sig  . sertraline (ZOLOFT) 100 MG tablet Take 1 tablet (100 mg total) by mouth daily.     Allergies:   Patient has no known allergies.   Social History  Socioeconomic History  . Marital status: Married    Spouse name: Not on file  . Number of children: 3  . Years of education: Not on file  . Highest education level: Not on file  Occupational History  . Occupation: acute care psych inpatient and HH--RN, has a PhD  . Occupation: changing jobs 05/2020  Tobacco Use  . Smoking status: Never Smoker  . Smokeless tobacco: Never Used  Substance and Sexual Activity  . Alcohol use: Yes    Alcohol/week: 0.0 standard drinks    Comment: Social  . Drug use: No  . Sexual activity: Not on file  Other Topics Concern  . Not on file  Social History Narrative   Has a PhD   3 children Born 2000, 2004, 2010   2 children live w/ her and her husband   Social  Determinants of Corporate investment banker Strain:   . Difficulty of Paying Living Expenses:   Food Insecurity:   . Worried About Programme researcher, broadcasting/film/video in the Last Year:   . Barista in the Last Year:   Transportation Needs:   . Freight forwarder (Medical):   Marland Kitchen Lack of Transportation (Non-Medical):   Physical Activity:   . Days of Exercise per Week:   . Minutes of Exercise per Session:   Stress:   . Feeling of Stress :   Social Connections:   . Frequency of Communication with Friends and Family:   . Frequency of Social Gatherings with Friends and Family:   . Attends Religious Services:   . Active Member of Clubs or Organizations:   . Attends Banker Meetings:   Marland Kitchen Marital Status:      Family History: The patient's family history includes Breast cancer in an other family member; CAD in an other family member; Colon cancer (age of onset: 67) in her father; Leukemia (age of onset: 10) in her mother. There is no history of Diabetes.  ROS:   Review of Systems  Constitutional: Negative.  HENT: Negative.   Eyes: Negative.   Cardiovascular: Positive for chest pain and palpitations.  Respiratory: Negative.   Endocrine: Negative.   Hematologic/Lymphatic: Negative.   Skin: Negative.   Musculoskeletal: Negative.   Gastrointestinal: Negative.   Genitourinary: Negative.   Neurological: Negative.   Psychiatric/Behavioral: The patient is nervous/anxious.   Allergic/Immunologic: Negative.    Please see the history of present illness.     All other systems reviewed and are negative.  EKGs/Labs/Other Studies Reviewed:    The following studies were reviewed today:   Recent Labs: 04/24/2020: ALT 7; BUN 14; Creatinine, Ser 0.93; Hemoglobin 12.3; Platelets 234.0; Potassium 3.5; Sodium 138; TSH 1.01  Recent Lipid Panel    Component Value Date/Time   CHOL 199 04/24/2020 1616   TRIG 77.0 04/24/2020 1616   HDL 69.00 04/24/2020 1616   CHOLHDL 3 04/24/2020 1616    VLDL 15.4 04/24/2020 1616   LDLCALC 114 (H) 04/24/2020 1616    Physical Exam:    VS:  BP 128/74   Pulse 93   Ht 5\' 11"  (1.803 m)   Wt 227 lb (103 kg)   SpO2 99%   BMI 31.66 kg/m     Wt Readings from Last 3 Encounters:  05/21/20 227 lb (103 kg)  04/24/20 233 lb 8 oz (105.9 kg)  04/15/20 226 lb 10.1 oz (102.8 kg)     GEN:  Well nourished, well developed in no acute distress HEENT: Normal NECK:  No JVD; No carotid bruits LYMPHATICS: No lymphadenopathy CARDIAC: RRR, no murmurs, rubs, gallops RESPIRATORY:  Clear to auscultation without rales, wheezing or rhonchi  ABDOMEN: Soft, non-tender, non-distended MUSCULOSKELETAL:  No edema; No deformity  SKIN: Warm and dry NEUROLOGIC:  Alert and oriented x 3 PSYCHIATRIC:  Normal affect     Signed, Norman Herrlich, MD  05/21/2020 4:19 PM    [facility]

## 2020-05-21 ENCOUNTER — Other Ambulatory Visit: Payer: Self-pay

## 2020-05-21 ENCOUNTER — Encounter: Payer: Self-pay | Admitting: Cardiology

## 2020-05-21 ENCOUNTER — Ambulatory Visit (INDEPENDENT_AMBULATORY_CARE_PROVIDER_SITE_OTHER): Payer: BC Managed Care – PPO

## 2020-05-21 ENCOUNTER — Ambulatory Visit (INDEPENDENT_AMBULATORY_CARE_PROVIDER_SITE_OTHER): Payer: BC Managed Care – PPO | Admitting: Cardiology

## 2020-05-21 VITALS — BP 128/74 | HR 93 | Ht 71.0 in | Wt 227.0 lb

## 2020-05-21 DIAGNOSIS — R002 Palpitations: Secondary | ICD-10-CM

## 2020-05-21 DIAGNOSIS — R079 Chest pain, unspecified: Secondary | ICD-10-CM

## 2020-05-21 DIAGNOSIS — E876 Hypokalemia: Secondary | ICD-10-CM

## 2020-05-21 NOTE — Patient Instructions (Addendum)
Medication Instructions:  Your physician recommends that you continue on your current medications as directed. Please refer to the Current Medication list given to you today.  *If you need a refill on your cardiac medications before your next appointment, please call your pharmacy*   Lab Work: None If you have labs (blood work) drawn today and your tests are completely normal, you will receive your results only by: . MyChart Message (if you have MyChart) OR . A paper copy in the mail If you have any lab test that is abnormal or we need to change your treatment, we will call you to review the results.   Testing/Procedures: A zio monitor was ordered today. It will remain on for 7 days. You will then return monitor and event diary in provided box. It takes 1-2 weeks for report to be downloaded and returned to us. We will call you with the results. If monitor falls off or has orange flashing light, please call Zio for further instructions.      Follow-Up: At [facility], you and your health needs are our priority.  As part of our continuing mission to provide you with exceptional heart care, we have created designated Provider Care Teams.  These Care Teams include your primary Cardiologist (physician) and Advanced Practice Providers (APPs -  Physician Assistants and Nurse Practitioners) who all work together to provide you with the care you need, when you need it.  We recommend signing up for the patient portal called "MyChart".  Sign up information is provided on this After Visit Summary.  MyChart is used to connect with patients for Virtual Visits (Telemedicine).  Patients are able to view lab/test results, encounter notes, upcoming appointments, etc.  Non-urgent messages can be sent to your provider as well.   To learn more about what you can do with MyChart, go to [email or web address].    Your next appointment:   As needed  The format for your next appointment:   In  Person  Provider:   Antwan Pandya, MD   Other Instructions 1. Avoid all over-the-counter antihistamines except Claritin/Loratadine and Zyrtec/Cetrizine. 2. Avoid all combination including cold sinus allergies flu decongestant and sleep medications 3. You can use Robitussin DM Mucinex and Mucinex DM for cough. 4. can use Tylenol aspirin ibuprofen and naproxen but no combinations such as sleep or sinus.    

## 2020-08-03 DIAGNOSIS — J029 Acute pharyngitis, unspecified: Secondary | ICD-10-CM | POA: Diagnosis not present

## 2020-08-03 DIAGNOSIS — R05 Cough: Secondary | ICD-10-CM | POA: Diagnosis not present

## 2020-08-03 DIAGNOSIS — R6883 Chills (without fever): Secondary | ICD-10-CM | POA: Diagnosis not present

## 2020-08-03 DIAGNOSIS — Z20822 Contact with and (suspected) exposure to covid-19: Secondary | ICD-10-CM | POA: Diagnosis not present

## 2020-10-05 DIAGNOSIS — Z23 Encounter for immunization: Secondary | ICD-10-CM | POA: Diagnosis not present

## 2020-10-06 ENCOUNTER — Other Ambulatory Visit: Payer: Self-pay | Admitting: Internal Medicine

## 2021-01-18 ENCOUNTER — Encounter: Payer: Self-pay | Admitting: Internal Medicine

## 2021-02-18 ENCOUNTER — Encounter: Payer: Self-pay | Admitting: Internal Medicine

## 2021-03-01 IMAGING — DX DG CHEST 1V PORT
1 series · 1 of 1 positions shown · non-contrast
Comparison: No pertinent prior studies available for comparison.

CLINICAL DATA: Chest pain.

EXAM:
PORTABLE CHEST 1 VIEW

[chest ap]
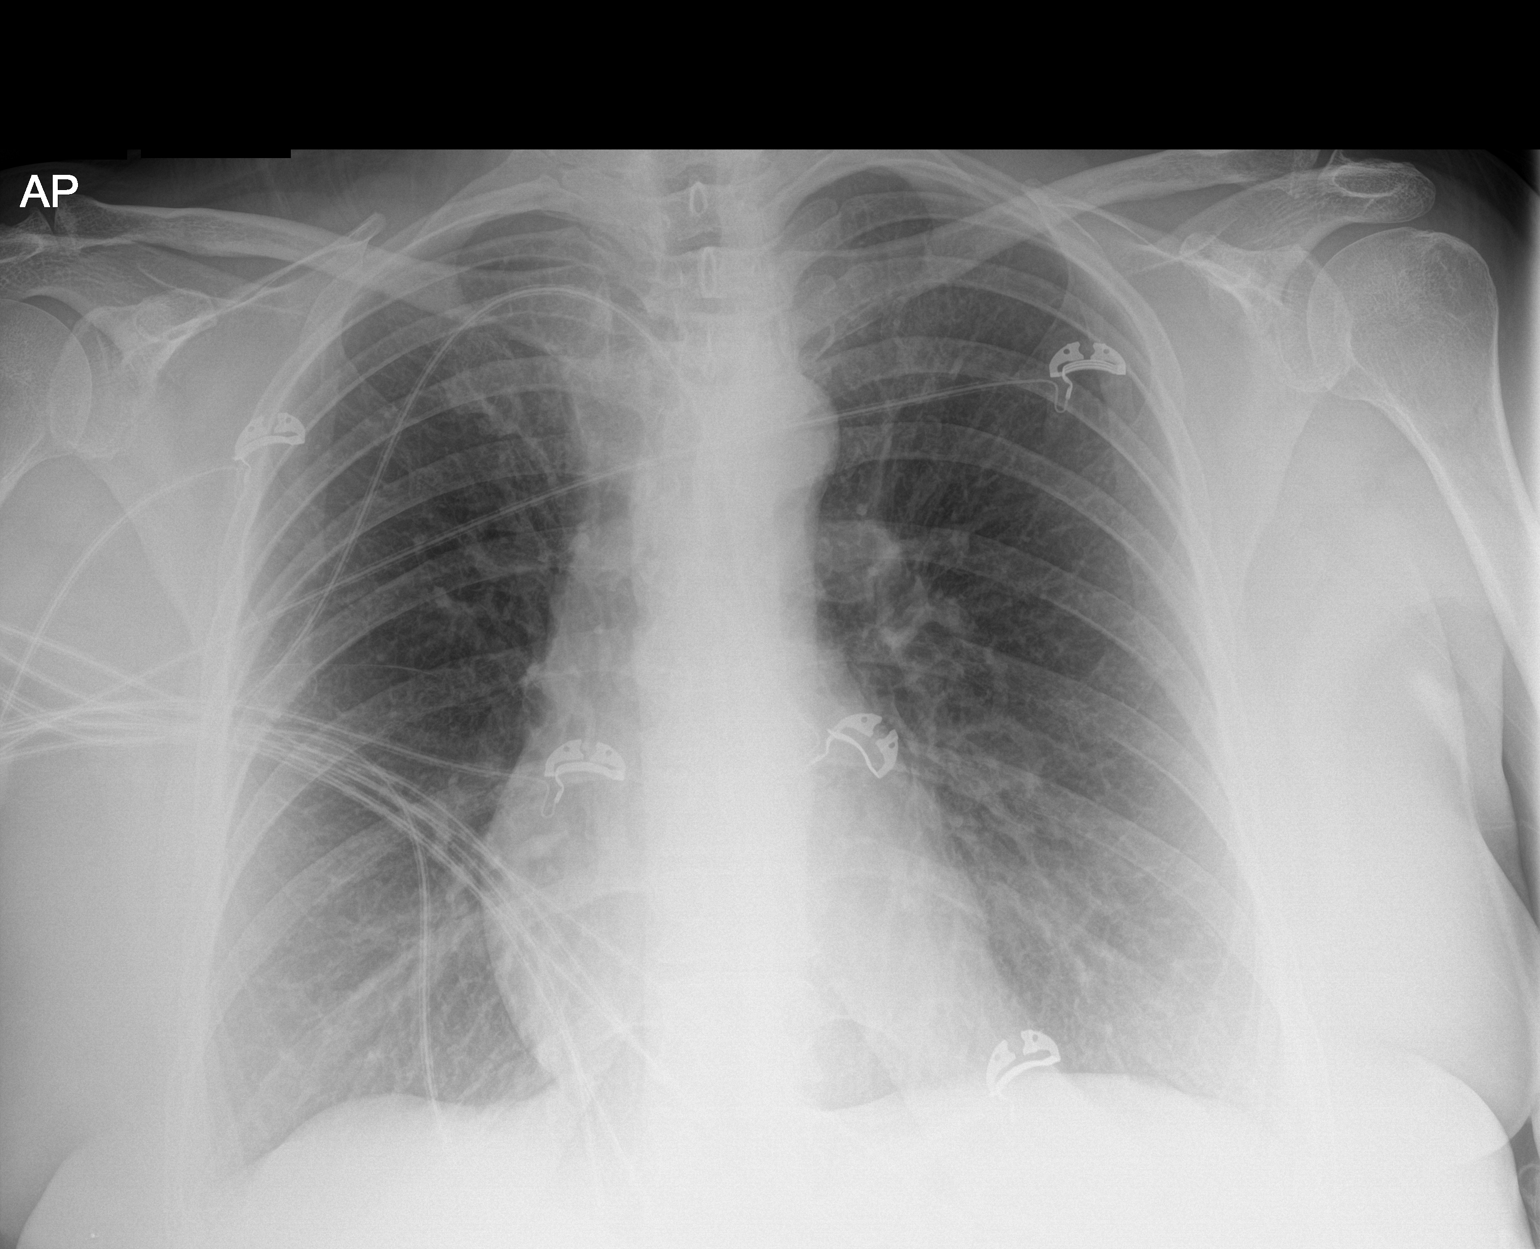

[1 of 1 positions shown; findings below may reference images not displayed]

FINDINGS: The patient is rotated to the right. Heart size within normal
limits. No evidence of airspace consolidation within the lungs. No
frank pulmonary edema. No evidence of pleural effusion or
pneumothorax. No acute bony abnormality identified.
IMPRESSION: No evidence of acute cardiopulmonary abnormality.

## 2021-03-30 ENCOUNTER — Other Ambulatory Visit: Payer: Self-pay | Admitting: Internal Medicine

## 2021-05-10 ENCOUNTER — Encounter: Payer: Self-pay | Admitting: Internal Medicine

## 2021-07-10 ENCOUNTER — Encounter: Payer: Self-pay | Admitting: Internal Medicine

## 2021-07-10 ENCOUNTER — Other Ambulatory Visit: Payer: Self-pay

## 2021-07-10 ENCOUNTER — Other Ambulatory Visit: Payer: Self-pay | Admitting: Internal Medicine

## 2021-07-10 ENCOUNTER — Ambulatory Visit (INDEPENDENT_AMBULATORY_CARE_PROVIDER_SITE_OTHER): Payer: BC Managed Care – PPO | Admitting: Internal Medicine

## 2021-07-10 VITALS — BP 136/70 | HR 71 | Temp 97.8°F | Resp 16 | Ht 71.0 in | Wt 241.0 lb

## 2021-07-10 DIAGNOSIS — G8929 Other chronic pain: Secondary | ICD-10-CM

## 2021-07-10 DIAGNOSIS — Z0000 Encounter for general adult medical examination without abnormal findings: Secondary | ICD-10-CM

## 2021-07-10 DIAGNOSIS — E010 Iodine-deficiency related diffuse (endemic) goiter: Secondary | ICD-10-CM | POA: Diagnosis not present

## 2021-07-10 DIAGNOSIS — Z1159 Encounter for screening for other viral diseases: Secondary | ICD-10-CM | POA: Diagnosis not present

## 2021-07-10 DIAGNOSIS — R739 Hyperglycemia, unspecified: Secondary | ICD-10-CM

## 2021-07-10 DIAGNOSIS — Z803 Family history of malignant neoplasm of breast: Secondary | ICD-10-CM

## 2021-07-10 DIAGNOSIS — M545 Low back pain, unspecified: Secondary | ICD-10-CM

## 2021-07-10 DIAGNOSIS — Z0001 Encounter for general adult medical examination with abnormal findings: Secondary | ICD-10-CM

## 2021-07-10 DIAGNOSIS — F988 Other specified behavioral and emotional disorders with onset usually occurring in childhood and adolescence: Secondary | ICD-10-CM

## 2021-07-10 LAB — CBC WITH DIFFERENTIAL/PLATELET
Basophils Absolute: 0 10*3/uL (ref 0.0–0.1)
Basophils Relative: 0.5 % (ref 0.0–3.0)
Eosinophils Absolute: 0.3 10*3/uL (ref 0.0–0.7)
Eosinophils Relative: 3 % (ref 0.0–5.0)
HCT: 40 % (ref 36.0–46.0)
Hemoglobin: 13.5 g/dL (ref 12.0–15.0)
Lymphocytes Relative: 17.5 % (ref 12.0–46.0)
Lymphs Abs: 1.5 10*3/uL (ref 0.7–4.0)
MCHC: 33.9 g/dL (ref 30.0–36.0)
MCV: 86 fl (ref 78.0–100.0)
Monocytes Absolute: 0.5 10*3/uL (ref 0.1–1.0)
Monocytes Relative: 5.5 % (ref 3.0–12.0)
Neutro Abs: 6.2 10*3/uL (ref 1.4–7.7)
Neutrophils Relative %: 73.5 % (ref 43.0–77.0)
Platelets: 219 10*3/uL (ref 150.0–400.0)
RBC: 4.65 Mil/uL (ref 3.87–5.11)
RDW: 14.7 % (ref 11.5–15.5)
WBC: 8.4 10*3/uL (ref 4.0–10.5)

## 2021-07-10 LAB — COMPREHENSIVE METABOLIC PANEL
ALT: 10 U/L (ref 0–35)
AST: 13 U/L (ref 0–37)
Albumin: 4.2 g/dL (ref 3.5–5.2)
Alkaline Phosphatase: 77 U/L (ref 39–117)
BUN: 14 mg/dL (ref 6–23)
CO2: 26 mEq/L (ref 19–32)
Calcium: 9.3 mg/dL (ref 8.4–10.5)
Chloride: 105 mEq/L (ref 96–112)
Creatinine, Ser: 0.92 mg/dL (ref 0.40–1.20)
GFR: 70.88 mL/min (ref >60.00)
Glucose, Bld: 96 mg/dL (ref 70–99)
Potassium: 4.1 mEq/L (ref 3.5–5.1)
Sodium: 138 mEq/L (ref 135–145)
Total Bilirubin: 0.5 mg/dL (ref 0.2–1.2)
Total Protein: 6.6 g/dL (ref 6.0–8.3)

## 2021-07-10 LAB — TSH: TSH: 1.54 u[IU]/mL (ref 0.35–5.50)

## 2021-07-10 LAB — LIPID PANEL
Cholesterol: 198 mg/dL (ref 0–200)
HDL: 65.5 mg/dL (ref >39.00)
LDL Cholesterol: 122 mg/dL — ABNORMAL HIGH (ref 0–99)
NonHDL: 132.96
Total CHOL/HDL Ratio: 3
Triglycerides: 53 mg/dL (ref 0.0–149.0)
VLDL: 10.6 mg/dL (ref 0.0–40.0)

## 2021-07-10 LAB — HEMOGLOBIN A1C: Hgb A1c MFr Bld: 5.7 % (ref 4.6–6.5)

## 2021-07-10 NOTE — Progress Notes (Addendum)
Subjective:    Patient ID: Rita Lambert, female    DOB: Aug 23, 1966, 55 y.o.   MRN: [id number]  DOS:  07/10/2021 Type of visit - description: Here for CPX  Here for CPX. Multiple issues discussed, see assessment and plan.  Wt Readings from Last 3 Encounters:  07/10/21 241 lb (109.3 kg)  05/21/20 227 lb (103 kg)  04/24/20 233 lb 8 oz (105.9 kg)    Review of Systems Denies chest pain or difficulty breathing.  Occasionally still has palpitations but typically related to anxiety. Report low back pain on and off, occasionally right knee pain.  Typically related with long work days or if she stands for too long.  No associated paresthesias. Weight gain noted, admits to poor diet.   Other than above, a 14 point review of systems is negative       Past Medical History:  Diagnosis Date   Allergy    Contraception    condoms   Depression     Past Surgical History:  Procedure Laterality Date   BREAST BIOPSY Left ~  12/2019   bx neg    MOUTH SURGERY     teeth removal, freniolectomy   VAGINAL DELIVERY     2000, 2004, and 2010   Social History   Socioeconomic History   Marital status: Married    Spouse name: Not on file   Number of children: 3   Years of education: Not on file   Highest education level: Not on file  Occupational History   Occupation: acute care psych inpatient and HH--RN, has a PhD    Comment: has 2 additional part time jobs  Tobacco Use   Smoking status: Never   Smokeless tobacco: Never  Substance and Sexual Activity   Alcohol use: Yes    Alcohol/week: 0.0 standard drinks    Comment: Social   Drug use: No   Sexual activity: Not on file  Other Topics Concern   Not on file  Social History Narrative   Household: pt and daughter       Has a PhD   3 children Born 2000, 2004, 2010   2 children live w/ her and her husband   Social Determinants of Corporate investment banker Strain: Not on file  Food Insecurity: Not on file  Transportation  Needs: Not on file  Physical Activity: Not on file  Stress: Not on file  Social Connections: Not on file  Intimate Partner Violence: Not on file    Allergies as of 07/10/2021   No Known Allergies      Medication List        Accurate as of July 10, 2021  3:22 PM. If you have any questions, ask your nurse or doctor.          sertraline 100 MG tablet Commonly known as: ZOLOFT TAKE 1 TABLET BY MOUTH EVERY DAY           Objective:   Physical Exam BP 136/70 (BP Location: Left Arm, Patient Position: Sitting, Cuff Size: Normal)   Pulse 71   Temp 97.8 F (36.6 C) (Oral)   Resp 16   Ht 5\' 11"  (1.803 m)   Wt 241 lb (109.3 kg)   SpO2 98%   BMI 33.61 kg/m  General: Well developed, NAD, BMI noted Neck: Thyroid gland is a slightly enlarged, not tender or nodular HEENT:  Normocephalic . Face symmetric, atraumatic Lungs:  CTA B Normal respiratory effort, no intercostal retractions, no accessory  muscle use. Heart: RRR,  no murmur.  Abdomen:  Not distended, soft, non-tender. No rebound or rigidity.   Lower extremities: no pretibial edema bilaterally  Skin: Exposed areas without rash. Not pale. Not jaundice Neurologic:  alert & oriented X3.  Speech normal, gait appropriate for age and unassisted Strength symmetric and appropriate for age.. Question of a straight leg test + bilaterally, mild. Normal DTRs Psych: Cognition and judgment appear intact.  Cooperative with normal attention span and concentration.  Behavior appropriate. No anxious or depressed appearing.     Assessment     Assessment Depression Allergies  Contraception: Condoms Perimenopausal as of 11-2017  Plan: Here for CPX Depression: She has a lot of stress, on sertraline, managing well. CP, DOE, palpitations: See last visit, subsequently seen by cardiology.  A long-term monitor showed what atrial tachycardia, offered treatment with Sectral 200 mg.(BBs) at this point, denies any chest pain or  DOE, she still has occasional palpitations.  She does not desire any medication.  Observation. Thyromegaly: [place] 05/04/2020 showing small nodules, thyromegaly, rec [place] q 1 or 2 years,  Physical exam today seems unchanged, we agreed on next ultrasound 04/2022. Weight gain: Extensive diet and exercise discussion Low back pain: Questionable + straight leg test.  We will start with a round of physical therapy.  Reassess in 3 to 4 months.  Judicious use of Tylenol and ibuprofen Family history of breast cancer: Cousin just found out that she has breast cancer, patient is concerned, referred to the geneticist ADD: At the end of the visit, she also mention possibly symptoms of ADD.   We will refer her to a psychologist; new onset ADD at her age sometimes related to sleep apnea but the screening for OSA was negative. RTC 1    In addition to CPX I addressed her chronic Medical problems and new problems: Low back pain, family history of breast cancer, we had extensive discussion about diet    This visit occurred during the SARS-CoV-2 public health emergency.  Safety protocols were in place, including screening questions prior to the visit, additional usage of staff PPE, and extensive cleaning of exam room while observing appropriate contact time as indicated for disinfecting solutions.

## 2021-07-10 NOTE — Patient Instructions (Addendum)
Recommend to proceed with the following vaccines at your pharmacy: Shingrix Covid #3 Flu shot this fall   will refer you to the geneticist and to get physical therapy  Consider weight watchers, Noom or talk with the wellness clinic.    GO TO THE LAB : Get the blood work     GO TO THE FRONT DESK, PLEASE SCHEDULE YOUR APPOINTMENTS Come back for   a physical exam in 1 year.

## 2021-07-10 NOTE — Assessment & Plan Note (Signed)
-   Td  04-02-16 - shingrix discussed   -COVID-vaccine: Rec booster -Recommend a flu shot every year - CCS: never had a cscope, cologuard  05-2019: Negative.  Next 05-2022 -  Female Care : perimenopausal (still periods) , due to seen gyn tomorrow; next MMG  already scheduled - Diet, exercise: Not doing well lately, extensively counseled -Labs: CMP, FLP, CBC,A1c, TSH, hep C

## 2021-07-10 NOTE — Assessment & Plan Note (Addendum)
Plan: Here for CPX Depression: She has a lot of stress, on sertraline, managing well. CP, DOE, palpitations: See last visit, subsequently seen by cardiology.  A long-term monitor showed what atrial tachycardia, offered treatment with Sectral 200 mg.(BBs) at this point, denies any chest pain or DOE, she still has occasional palpitations.  She does not desire any medication.  Observation. Thyromegaly: [place] 05/04/2020 showing small nodules, thyromegaly, rec [place] q 1 or 2 years,  Physical exam today seems unchanged, we agreed on next ultrasound 04/2022. Weight gain: Extensive diet and exercise discussion Low back pain: Questionable + straight leg test.  We will start with a round of physical therapy.  Reassess in 3 to 4 months.  Judicious use of Tylenol and ibuprofen Family history of breast cancer: Cousin just found out that she has breast cancer, patient is concerned, referred to the geneticist ADD: At the end of the visit, she also mention possibly symptoms of ADD.   We will refer her to a psychologist; new onset ADD at her age sometimes related to sleep apnea but the screening for OSA was negative. RTC 1

## 2021-07-11 LAB — HEPATITIS C ANTIBODY
Hepatitis C Ab: NONREACTIVE
SIGNAL TO CUT-OFF: 0.01 (ref <1.00)

## 2021-07-12 DIAGNOSIS — Z803 Family history of malignant neoplasm of breast: Secondary | ICD-10-CM | POA: Diagnosis not present

## 2021-07-12 DIAGNOSIS — Z1231 Encounter for screening mammogram for malignant neoplasm of breast: Secondary | ICD-10-CM | POA: Diagnosis not present

## 2021-07-12 LAB — HM MAMMOGRAPHY

## 2021-09-09 DIAGNOSIS — Z8481 Family history of carrier of genetic disease: Secondary | ICD-10-CM | POA: Diagnosis not present

## 2021-09-26 DIAGNOSIS — Z8481 Family history of carrier of genetic disease: Secondary | ICD-10-CM | POA: Diagnosis not present

## 2021-09-26 DIAGNOSIS — Z8042 Family history of malignant neoplasm of prostate: Secondary | ICD-10-CM | POA: Diagnosis not present

## 2021-09-26 DIAGNOSIS — Z801 Family history of malignant neoplasm of trachea, bronchus and lung: Secondary | ICD-10-CM | POA: Diagnosis not present

## 2021-09-26 DIAGNOSIS — Z803 Family history of malignant neoplasm of breast: Secondary | ICD-10-CM | POA: Diagnosis not present

## 2022-01-10 ENCOUNTER — Encounter: Payer: Self-pay | Admitting: Internal Medicine

## 2022-03-11 DIAGNOSIS — S83279A Complex tear of lateral meniscus, current injury, unspecified knee, initial encounter: Secondary | ICD-10-CM | POA: Insufficient documentation

## 2022-05-16 ENCOUNTER — Other Ambulatory Visit: Payer: Self-pay

## 2022-05-16 DIAGNOSIS — E010 Iodine-deficiency related diffuse (endemic) goiter: Secondary | ICD-10-CM

## 2022-06-24 ENCOUNTER — Ambulatory Visit (HOSPITAL_BASED_OUTPATIENT_CLINIC_OR_DEPARTMENT_OTHER): Payer: BC Managed Care – PPO

## 2022-07-13 ENCOUNTER — Other Ambulatory Visit: Payer: Self-pay | Admitting: Internal Medicine

## 2022-08-07 ENCOUNTER — Ambulatory Visit (HOSPITAL_BASED_OUTPATIENT_CLINIC_OR_DEPARTMENT_OTHER): Payer: Self-pay

## 2022-09-16 LAB — COLOGUARD: Cologuard: NEGATIVE

## 2022-09-23 ENCOUNTER — Encounter: Payer: Self-pay | Admitting: Internal Medicine

## 2022-10-01 ENCOUNTER — Ambulatory Visit (INDEPENDENT_AMBULATORY_CARE_PROVIDER_SITE_OTHER): Payer: 59 | Admitting: Internal Medicine

## 2022-10-01 ENCOUNTER — Encounter: Payer: Self-pay | Admitting: Internal Medicine

## 2022-10-01 VITALS — BP 124/76 | HR 98 | Temp 97.8°F | Resp 16 | Ht 71.0 in | Wt 226.1 lb

## 2022-10-01 DIAGNOSIS — R739 Hyperglycemia, unspecified: Secondary | ICD-10-CM | POA: Diagnosis not present

## 2022-10-01 DIAGNOSIS — Z0000 Encounter for general adult medical examination without abnormal findings: Secondary | ICD-10-CM | POA: Diagnosis not present

## 2022-10-01 NOTE — Progress Notes (Unsigned)
Subjective:    Patient ID: Rita Lambert, female    DOB: 06-04-1966, 56 y.o.   MRN: [id number]  DOS:  10/01/2022 Type of visit - description: CPX  Here for CPX Patient is doing a much better diet, + weight loss on her own scales, feeling great, "this is the better I have filly years". Unfortunately she also has bilateral knee pain under the care of Ortho.  Unable to exercise.  Wt Readings from Last 3 Encounters:  10/01/22 226 lb 2 oz (102.6 kg)  07/10/21 241 lb (109.3 kg)  05/21/20 227 lb (103 kg)     Review of Systems See above   Past Medical History:  Diagnosis Date   Allergy    Contraception    condoms   Depression     Past Surgical History:  Procedure Laterality Date   BREAST BIOPSY Left ~  12/2019   bx neg    MOUTH SURGERY     teeth removal, freniolectomy   VAGINAL DELIVERY     2000, 2004, and 2010    Current Outpatient Medications  Medication Instructions   meloxicam (MOBIC) 15 mg, Oral, Daily   sertraline (ZOLOFT) 100 MG tablet TAKE 1 TABLET BY MOUTH EVERY DAY       Objective:   Physical Exam BP 124/76   Pulse 98   Temp 97.8 F (36.6 C) (Oral)   Resp 16   Ht 5\' 11"  (1.803 m)   Wt 226 lb 2 oz (102.6 kg)   LMP 11/24/2017 (Approximate)   SpO2 98%   BMI 31.54 kg/m  General: Well developed, NAD, BMI noted Neck: No  thyromegaly  HEENT:  Normocephalic . Face symmetric, atraumatic Lungs:  CTA B Normal respiratory effort, no intercostal retractions, no accessory muscle use. Heart: RRR,  no murmur.  Abdomen:  Not distended, soft, non-tender. No rebound or rigidity.   Lower extremities: no pretibial edema bilaterally  Skin: Exposed areas without rash. Not pale. Not jaundice Neurologic:  alert & oriented X3.  Speech normal, gait appropriate for age and unassisted Strength symmetric and appropriate for age.  Psych: Cognition and judgment appear intact.  Cooperative with normal attention span and concentration.  Behavior appropriate. No  anxious or depressed appearing.     Assessment    Assessment Depression Allergies  Menopausal LMP July 2022 Thyromegaly (per August 2022 2021)  PLAN Here for CPX Depression: Seems well controlled on Zoloft Knee pain bilaterally: Follow-up by Ortho, unable to work without restrictions.  On meloxicam, GI precautions reminded, she takes it infrequently. Thyromegaly: per 2022 2021, due for a repeat 2022, she will call when ready, aware of the issue. RTC 1 year  The 10-year ASCVD risk score (Arnett DK, et al., 2019) is: 1.6%   Values used to calculate the score:     Age: 53 years     Sex: Female     Is Non-Hispanic African American: No     Diabetic: No     Tobacco smoker: No     Systolic Blood Pressure: 124 mmHg     Is BP treated: No     HDL Cholesterol: 65.5 mg/dL     Total Cholesterol: 198 mg/dL    - Td  54 - shingrix discussed   -COVID-vaccine: Rec booster, benefits d/w pt  -Had a flu shot - CCS: never had a cscope, cologuard  05-2019: Negative. Cologuard: per pt she just send a sample, not ordered by me, advise to let me know the results  -  Female Care:  Menopausal , to see gyn next month,  MMG 07-2021, f/u by the "high risk clinic" / cancer center @ [facility] per pt  - Diet, exercise: Doing great with diet, + weight loss.  Unable to exercise for now due to bilateral knee injury follow-up by Ortho - Labs: FLP CBC CMP A1c    Here for CPX Depression: She has a lot of stress, on sertraline, managing well. CP, DOE, palpitations: See last visit, subsequently seen by cardiology.  A long-term monitor showed what atrial tachycardia, offered treatment with Sectral 200 mg.(BBs) at this point, denies any chest pain or DOE, she still has occasional palpitations.  She does not desire any medication.  Observation. Thyromegaly: [place] 05/04/2020 showing small nodules, thyromegaly, rec [place] q 1 or 2 years,  Physical exam today seems unchanged, we agreed on next ultrasound 04/2022. Weight gain:  Extensive diet and exercise discussion Low back pain: Questionable + straight leg test.  We will start with a round of physical therapy.  Reassess in 3 to 4 months.  Judicious use of Tylenol and ibuprofen Family history of breast cancer: Cousin just found out that she has breast cancer, patient is concerned, referred to the geneticist ADD: At the end of the visit, she also mention possibly symptoms of ADD.   We will refer her to a psychologist; new onset ADD at her age sometimes related to sleep apnea but the screening for OSA was negative. RTC 1

## 2022-10-01 NOTE — Patient Instructions (Addendum)
Vaccines I recommend:  Covid booster Shingrix (shingles)  Please keep me posted about your Cologuard results  Please call when you are ready to have a thyroid ultrasound   GO TO THE FRONT DESK, PLEASE SCHEDULE YOUR APPOINTMENTS  Come back for   blood work fasting at your convenience  Come back for a physical exam in 1 year

## 2022-10-02 ENCOUNTER — Encounter: Payer: Self-pay | Admitting: Internal Medicine

## 2022-10-02 NOTE — Assessment & Plan Note (Signed)
-   Td  04-02-16 - shingrix discussed   -COVID-vaccine: Rec booster, benefits d/w pt  -Had a flu shot CCS: never had a cscope -Cologuard  05-2019: Negative.  -Cologuard: per pt she just send a sample, not ordered by me, advise to let me know the results  -  Female Care:  Menopausal , to see gyn next month,  MMG 07-2021, f/u by the "high risk clinic" / cancer center @ [facility] per pt  - Diet, exercise: Doing great with diet, + weight loss.  Unable to exercise for now due to bilateral knee injury follow-up by Ortho - Labs: FLP CBC CMP A1c

## 2022-10-02 NOTE — Assessment & Plan Note (Signed)
Here for CPX Depression: Seems well controlled on Zoloft Knee pain bilaterally: Follow-up by Ortho, unable to work d/t  restrictions related to knee pain.  On meloxicam, GI precautions reminded, she takes it infrequently. Thyromegaly: per [place] 2021, due for a repeat US, declines for now, she will call when ready, aware of the issue. RTC 1 year

## 2022-10-06 ENCOUNTER — Telehealth: Payer: Self-pay | Admitting: Licensed Clinical Social Worker

## 2022-10-06 NOTE — Patient Outreach (Signed)
  Care Coordination   10/06/2022 Name: Rita Lambert MRN: [id number] DOB: 1966/09/15   Care Coordination Outreach Attempts:  An unsuccessful telephone outreach was attempted today to offer the patient information about available care coordination services as a benefit of their health plan.   Follow Up Plan:  Additional outreach attempts will be made to offer the patient care coordination information and services.   Encounter Outcome:  No Answer   Care Coordination Interventions:  No, not indicated    Sammuel Hines, LCSW Social Work Care Coordination  [facility] [employer] [employer] [phone]

## 2022-10-08 ENCOUNTER — Encounter: Payer: Self-pay | Admitting: Internal Medicine

## 2022-10-08 ENCOUNTER — Other Ambulatory Visit (INDEPENDENT_AMBULATORY_CARE_PROVIDER_SITE_OTHER): Payer: 59

## 2022-10-08 DIAGNOSIS — R739 Hyperglycemia, unspecified: Secondary | ICD-10-CM

## 2022-10-08 LAB — COLOGUARD: COLOGUARD: NEGATIVE

## 2022-10-09 LAB — HEMOGLOBIN A1C
Hgb A1c MFr Bld: 5.7 % of total Hgb — ABNORMAL HIGH (ref <5.7)
Mean Plasma Glucose: 117 mg/dL
eAG (mmol/L): 6.5 mmol/L

## 2022-10-09 LAB — COMPREHENSIVE METABOLIC PANEL
AG Ratio: 1.9 (calc) (ref 1.0–2.5)
ALT: 8 U/L (ref 6–29)
AST: 13 U/L (ref 10–35)
Albumin: 4.3 g/dL (ref 3.6–5.1)
Alkaline phosphatase (APISO): 80 U/L (ref 37–153)
BUN: 12 mg/dL (ref 7–25)
CO2: 25 mmol/L (ref 20–32)
Calcium: 9.4 mg/dL (ref 8.6–10.4)
Chloride: 106 mmol/L (ref 98–110)
Creat: 1.01 mg/dL (ref 0.50–1.03)
Globulin: 2.3 g/dL (calc) (ref 1.9–3.7)
Glucose, Bld: 63 mg/dL — ABNORMAL LOW (ref 65–99)
Potassium: 4.2 mmol/L (ref 3.5–5.3)
Sodium: 142 mmol/L (ref 135–146)
Total Bilirubin: 0.4 mg/dL (ref 0.2–1.2)
Total Protein: 6.6 g/dL (ref 6.1–8.1)

## 2022-10-09 LAB — CBC WITH DIFFERENTIAL/PLATELET
Absolute Monocytes: 474 cells/uL (ref 200–950)
Basophils Absolute: 40 cells/uL (ref 0–200)
Basophils Relative: 0.5 %
Eosinophils Absolute: 332 cells/uL (ref 15–500)
Eosinophils Relative: 4.2 %
HCT: 37.6 % (ref 35.0–45.0)
Hemoglobin: 12.7 g/dL (ref 11.7–15.5)
Lymphs Abs: 1635 cells/uL (ref 850–3900)
MCH: 28.6 pg (ref 27.0–33.0)
MCHC: 33.8 g/dL (ref 32.0–36.0)
MCV: 84.7 fL (ref 80.0–100.0)
MPV: 10.5 fL (ref 7.5–12.5)
Monocytes Relative: 6 %
Neutro Abs: 5419 cells/uL (ref 1500–7800)
Neutrophils Relative %: 68.6 %
Platelets: 253 10*3/uL (ref 140–400)
RBC: 4.44 10*6/uL (ref 3.80–5.10)
RDW: 12.9 % (ref 11.0–15.0)
Total Lymphocyte: 20.7 %
WBC: 7.9 10*3/uL (ref 3.8–10.8)

## 2022-10-09 LAB — LIPID PANEL
Cholesterol: 187 mg/dL (ref <200)
HDL: 63 mg/dL (ref >=50)
LDL Cholesterol (Calc): 110 mg/dL (calc) — ABNORMAL HIGH
Non-HDL Cholesterol (Calc): 124 mg/dL (calc) (ref <130)
Total CHOL/HDL Ratio: 3 (calc) (ref <5.0)
Triglycerides: 59 mg/dL (ref <150)

## 2023-01-14 ENCOUNTER — Ambulatory Visit
Admission: EM | Admit: 2023-01-14 | Discharge: 2023-01-14 | Disposition: A | Discharge status: Home / Self Care | Payer: 59 | Attending: Emergency Medicine | Admitting: Emergency Medicine

## 2023-01-14 ENCOUNTER — Ambulatory Visit: Admit: 2023-01-14 | Discharge status: Absent without leave | Payer: 59

## 2023-01-14 DIAGNOSIS — M25511 Pain in right shoulder: Secondary | ICD-10-CM | POA: Diagnosis not present

## 2023-01-14 DIAGNOSIS — M542 Cervicalgia: Secondary | ICD-10-CM

## 2023-01-14 DIAGNOSIS — M25512 Pain in left shoulder: Secondary | ICD-10-CM | POA: Diagnosis not present

## 2023-01-14 MED ORDER — PREDNISONE 20 MG PO TABS: 40.0000 mg | ORAL_TABLET | Freq: Every day | ORAL | 0 refills | Status: DC

## 2023-01-14 MED ORDER — CYCLOBENZAPRINE HCL 10 MG PO TABS: 10.0000 mg | ORAL_TABLET | Freq: Two times a day (BID) | ORAL | 0 refills | Status: DC | PRN
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
## 2023-03-10 HISTORY — PX: KNEE ARTHROSCOPY W/ MENISCECTOMY: SHX1879

## 2023-04-13 DIAGNOSIS — S83272D Complex tear of lateral meniscus, current injury, left knee, subsequent encounter: Secondary | ICD-10-CM | POA: Diagnosis not present

## 2023-06-08 ENCOUNTER — Other Ambulatory Visit: Payer: Self-pay | Admitting: Oncology

## 2023-06-08 DIAGNOSIS — Z006 Encounter for examination for normal comparison and control in clinical research program: Secondary | ICD-10-CM

## 2023-08-10 ENCOUNTER — Other Ambulatory Visit: Payer: Self-pay | Admitting: Family

## 2023-08-14 NOTE — Telephone Encounter (Signed)
Rx sent. Was previously refilled in Padonda's name, the request went to her office

## 2023-08-14 NOTE — Telephone Encounter (Signed)
Pt called to follow up on med refill request. Pt advised that she currently has 4 days left of the medication and needs to have it refilled. After reviewing chart, noted pharmacy sent digital request originally on 9.30.24 and had requested a follow up on 10.2.24. Advised a message would be sent back to look into this. Routed HP due to lack info over 4 days and time sensitivity.

## 2023-10-05 ENCOUNTER — Encounter: Payer: 59 | Admitting: Internal Medicine

## 2023-11-15 ENCOUNTER — Other Ambulatory Visit: Payer: Self-pay | Admitting: Internal Medicine

## 2023-12-07 ENCOUNTER — Encounter: Payer: 59 | Admitting: Internal Medicine

## 2024-02-03 ENCOUNTER — Ambulatory Visit: Payer: Self-pay | Admitting: Internal Medicine

## 2024-02-22 ENCOUNTER — Other Ambulatory Visit: Payer: Self-pay | Admitting: Internal Medicine

## 2024-03-02 ENCOUNTER — Encounter: Payer: Self-pay | Admitting: Internal Medicine

## 2024-03-28 ENCOUNTER — Ambulatory Visit
Admission: RE | Admit: 2024-03-28 | Discharge: 2024-03-28 | Disposition: A | Discharge status: Home / Self Care | Source: Ambulatory Visit | Attending: Emergency Medicine | Admitting: Emergency Medicine

## 2024-03-28 ENCOUNTER — Ambulatory Visit (INDEPENDENT_AMBULATORY_CARE_PROVIDER_SITE_OTHER)

## 2024-03-28 ENCOUNTER — Ambulatory Visit: Payer: Self-pay | Admitting: Emergency Medicine

## 2024-03-28 ENCOUNTER — Ambulatory Visit

## 2024-03-28 VITALS — BP 132/87 | HR 97 | Temp 97.9°F | Resp 16

## 2024-03-28 DIAGNOSIS — S99922A Unspecified injury of left foot, initial encounter: Secondary | ICD-10-CM

## 2024-03-28 DIAGNOSIS — W108XXA Fall (on) (from) other stairs and steps, initial encounter: Secondary | ICD-10-CM | POA: Diagnosis not present

## 2024-03-28 DIAGNOSIS — M25462 Effusion, left knee: Secondary | ICD-10-CM | POA: Diagnosis not present

## 2024-03-28 DIAGNOSIS — M79672 Pain in left foot: Secondary | ICD-10-CM | POA: Diagnosis not present

## 2024-03-28 DIAGNOSIS — M25562 Pain in left knee: Secondary | ICD-10-CM

## 2024-03-28 DIAGNOSIS — M23262 Derangement of other lateral meniscus due to old tear or injury, left knee: Secondary | ICD-10-CM

## 2024-03-28 DIAGNOSIS — M1712 Unilateral primary osteoarthritis, left knee: Secondary | ICD-10-CM | POA: Diagnosis not present

## 2024-03-28 DIAGNOSIS — M7732 Calcaneal spur, left foot: Secondary | ICD-10-CM | POA: Diagnosis not present

## 2024-03-28 MED ORDER — ACETAMINOPHEN 500 MG PO TABS
1000.0000 mg | ORAL_TABLET | Freq: Once | ORAL | Status: AC
  Administered 2024-03-28: 1000 mg via ORAL

## 2024-03-28 MED ORDER — MELOXICAM 15 MG PO TABS: 15.0000 mg | ORAL_TABLET | Freq: Every day | ORAL | 0 refills | Status: AC

## 2024-03-28 MED ORDER — ACETAMINOPHEN 500 MG PO TABS: 1000.0000 mg | ORAL_TABLET | Freq: Three times a day (TID) | ORAL | 0 refills | Status: AC | PRN

## 2024-03-28 MED ORDER — IBUPROFEN 400 MG PO TABS
400.0000 mg | ORAL_TABLET | Freq: Once | ORAL | Status: AC
  Administered 2024-03-28: 400 mg via ORAL

## 2024-03-28 NOTE — ED Triage Notes (Addendum)
 Pt states that at 0600 today she missed a step at home and fell on her knees. Her left knee has been swollen and painful with limited mobility. She states walking, standing, and turning have been difficult. She states her left big toe has also been sore.   Pt states she previous has bilateral torn meniscus that have not been repaired.    Home Interventions: Ibuprofen  at 1200 today

## 2024-03-28 NOTE — Discharge Instructions (Addendum)
 The official radiology report of your left knee and left foot imaging is still pending.  Per my personal interpretation, I do not believe that you have broken any bones.  That being said, strains and sprains as well as possible worsening of your known bucket handle tear of the lateral aspect of your left meniscus are all things to consider at this time.  I recommend that you follow-up with your knee specialist as soon as possible to discuss definitive treatment of your meniscus tear.  I believe that you were last seen by Dr. Tyson Gals at [facility] orthopedics.  I realize that recovering from knee surgery can be time-consuming but please also keep in mind that he risk of more serious, more debilitating injury to your left knee increases with every misstep and every fall.  While you are waiting to be seen by Dr. Tyson Gals, please wear the stabilizing, hinged knee brace that we have provided for you whenever you standing or walking.  You do not need to wear it when you are sleeping.  To address the swelling in your knee, I recommend that you stay off of your left knee is much as possible, keep your knee elevated above the level of your heart and apply ice to your knee for 20 minutes 4 times daily.  To address the pain in your left great toe, we provided you with a postop shoe to use when you are standing or ambulating.  For pain management, I recommend that you resume meloxicam  15 mg every day and begin Tylenol  1000 mg every 8 hours for pain and take both on a scheduled basis, not "as needed".  This will keep your pain fairly well-controlled.  Thank you for visiting [facility] today.  We appreciate the opportunity to participate in your care.

## 2024-03-28 NOTE — Progress Notes (Signed)
 Please advise patient that x-ray of her left great toe did not show any sign of acute fracture that she is likely sprained it.  I recommend that she continue to wear the postop boot for at least 2 weeks or until her toe is feeling completely better.  Thank you.

## 2024-03-28 NOTE — Progress Notes (Signed)
 Please advise patient that x-ray is concerning for tricompartmental arthritis but no acute injury.  Recommend that she continue to wear the hinged knee brace, take meloxicam  and Tylenol  until she is able to follow-up with her knee specialist.  Thank you.

## 2024-03-28 NOTE — ED Provider Notes (Signed)
 [facility]    CSN: [id number] Arrival date & time: 03/28/24  1519    HISTORY   Chief Complaint  Patient presents with   Knee Injury    I missed a step at home and fell on my knees.left knee swollen and painful with limited mobility.  Walking, standing, turning difficult. - Entered by patient   Toe Pain   HPI Rita Lambert is a pleasant, 58 y.o. female who presents to urgent care today. Patient states that when living around this morning at 6 AM, she misstepped going down her front steps from her home, tripped over her left foot and fell directly on her left knee also causing her left great toe to hyperextend.  Patient states since then, her knee has been swollen, she has had limited mobility secondary to pain.  Patient states standing, walking and turning have all been difficult.  Patient states her left great toe has also been sore and appears to be bruised.  Patient states she has a known past medical history of a complicated bilateral torn lateral meniscus, has been scheduled for repair but has been putting this off because she does not "have time", states she works 4 jobs.  EMR reviewed by me, patient was initially diagnosed with a bucket tear of the lateral meniscus of her left knee, had previously been taking meloxicam  daily for pain relief and had been advised to undergo arthroscopy of her left knee in April 2024, surgery was scheduled but she canceled it.   The history is provided by the patient.   Past Medical History:  Diagnosis Date   Allergy    Depression    Patient Active Problem List   Diagnosis Date Noted   Complex tear of lateral meniscus of knee 03/11/2022   Thyromegaly 07/10/2021   Left shoulder pain 04/10/2016   PCP NOTES >>>>>>>>>>>>>>>> 04/09/2016   Annual physical exam 04/02/2016   Anxiety and depression 06/26/2015   Past Surgical History:  Procedure Laterality Date   BREAST BIOPSY Left ~  12/2019   bx neg    KNEE ARTHROSCOPY W/ MENISCECTOMY  Left 03/10/2023   Pt states this was canceled   MOUTH SURGERY     teeth removal, freniolectomy   VAGINAL DELIVERY     2000, 2004, and 2010   OB History     Gravida  3   Para      Term      Preterm      AB      Living         SAB      IAB      Ectopic      Multiple      Live Births  3          Home Medications    Prior to Admission medications   Medication Sig Start Date End Date Taking? Authorizing Provider  cyclobenzaprine  (FLEXERIL ) 10 MG tablet Take 1 tablet (10 mg total) by mouth 2 (two) times daily as needed for muscle spasms. 01/14/23   White, Maybelle Spatz, NP  meloxicam  (MOBIC ) 15 MG tablet Take 15 mg by mouth daily. 04/01/22   [provider]  predniSONE  (DELTASONE ) 20 MG tablet Take 2 tablets (40 mg total) by mouth daily. 01/14/23   White, Maybelle Spatz, NP  sertraline  (ZOLOFT ) 100 MG tablet Take 1 tablet (100 mg total) by mouth daily. 02/22/24   Ezell Hollow, MD    Family History Family History  Problem Relation Age  of Onset   Breast cancer Other        aunt   CAD Other        GF, uncle   Leukemia Mother 62   Colon cancer Father 30   Diabetes Neg Hx    Social History Social History   Tobacco Use   Smoking status: Never    Passive exposure: Never   Smokeless tobacco: Never  Vaping Use   Vaping status: Never Used  Substance Use Topics   Alcohol use: Yes    Comment: 1 every 2-3 weeks   Drug use: No   Allergies   Patient has no known allergies.  Review of Systems Review of Systems Pertinent findings revealed after performing a 14 point review of systems has been noted in the history of present illness.  Physical Exam Vital Signs BP 132/87 (BP Location: Right Arm)   Pulse 97   Temp 97.9 F (36.6 C) (Oral)   Resp 16   LMP 11/24/2017 (Approximate)   SpO2 96%   No data found.  Physical Exam Vitals and nursing note reviewed.  Constitutional:      General: She is not in acute distress.    Appearance: Normal appearance.   HENT:     Head: Normocephalic and atraumatic.  Cardiovascular:     Rate and Rhythm: Normal rate and regular rhythm.  Pulmonary:     Effort: Pulmonary effort is normal.     Breath sounds: Normal breath sounds.  Musculoskeletal:     Cervical back: Normal range of motion and neck supple.     Left knee: Swelling present. No deformity, effusion, ecchymosis, lacerations, bony tenderness or crepitus. Decreased range of motion. Tenderness (Patellofemoral tendon) present over the medial joint line and lateral joint line. Normal alignment, normal meniscus and normal patellar mobility.       Feet:  Skin:    General: Skin is warm and dry.  Neurological:     General: No focal deficit present.     Mental Status: She is alert and oriented to person, place, and time. Mental status is at baseline.  Psychiatric:        Mood and Affect: Mood normal.        Behavior: Behavior normal.        Thought Content: Thought content normal.        Judgment: Judgment normal.     Visual Acuity Right Eye Distance:   Left Eye Distance:   Bilateral Distance:    Right Eye Near:   Left Eye Near:    Bilateral Near:     UC Couse / Diagnostics / Procedures:     Radiology No results found.  Procedures Procedures (including critical care time) EKG  Pending results:  Labs Reviewed - No data to display  Medications Ordered in UC: Medications  acetaminophen  (TYLENOL ) tablet 1,000 mg (1,000 mg Oral Given 03/28/24 1549)  ibuprofen  (ADVIL ) tablet 400 mg (400 mg Oral Given 03/28/24 1548)    UC Diagnoses / Final Clinical Impressions(s)   I have reviewed the triage vital signs and the nursing notes.  Pertinent labs & imaging results that were available during my care of the patient were reviewed by me and considered in my medical decision making (see chart for details).    Final diagnoses:  Acute pain of left knee  Injury of left great toe, initial encounter  Old bucket handle tear of lateral meniscus of  left knee  Fall down stairs, initial encounter   Patient advised  all imaging was negative for acute bony injury.  Patient advised of tricompartmental arthritis seen on x-ray of left knee.  Patient provided with a postop shoe for comfort and a hinged knee brace for stability.  Patient strongly encouraged to follow-up with orthopedics for further evaluation and treatment.  Patient provided with renewal of meloxicam  and advised to take Tylenol  1000 mg 3 times daily on a scheduled basis.  Please see discharge instructions below for details of plan of care as provided to patient. ED Prescriptions     Medication Sig Dispense Auth. Provider   meloxicam  (MOBIC ) 15 MG tablet Take 1 tablet (15 mg total) by mouth daily. 30 tablet Eloise Hake Scales, PA-C   acetaminophen  (TYLENOL ) 500 MG tablet Take 2 tablets (1,000 mg total) by mouth every 8 (eight) hours as needed for mild pain (pain score 1-3) or fever. 180 tablet Eloise Hake Scales, PA-C      PDMP not reviewed this encounter.  Pending results:  Labs Reviewed - No data to display    Discharge Instructions      The official radiology report of your left knee and left foot imaging is still pending.  Per my personal interpretation, I do not believe that you have broken any bones.  That being said, strains and sprains as well as possible worsening of your known bucket handle tear of the lateral aspect of your left meniscus are all things to consider at this time.  I recommend that you follow-up with your knee specialist as soon as possible to discuss definitive treatment of your meniscus tear.  I believe that you were last seen by Dr. Tyson Gals at [facility] orthopedics.  I realize that recovering from knee surgery can be time-consuming but please also keep in mind that he risk of more serious, more debilitating injury to your left knee increases with every misstep and every fall.  While you are waiting to be seen by Dr.  Tyson Gals, please wear the stabilizing, hinged knee brace that we have provided for you whenever you standing or walking.  You do not need to wear it when you are sleeping.  To address the swelling in your knee, I recommend that you stay off of your left knee is much as possible, keep your knee elevated above the level of your heart and apply ice to your knee for 20 minutes 4 times daily.  To address the pain in your left great toe, we provided you with a postop shoe to use when you are standing or ambulating.  For pain management, I recommend that you resume meloxicam  15 mg every day and begin Tylenol  1000 mg every 8 hours for pain and take both on a scheduled basis, not "as needed".  This will keep your pain fairly well-controlled.  Thank you for visiting [facility] today.  We appreciate the opportunity to participate in your care.       Disposition Upon Discharge:  Condition: stable for discharge home  Patient presented with an acute illness with associated systemic symptoms and significant discomfort requiring urgent management. In my opinion, this is a condition that a prudent lay person (someone who possesses an average knowledge of health and medicine) may potentially expect to result in complications if not addressed urgently such as respiratory distress, impairment of bodily function or dysfunction of bodily organs.   Routine symptom specific, illness specific and/or disease specific instructions were discussed with the patient and/or caregiver at length.   As such,  the patient has been evaluated and assessed, work-up was performed and treatment was provided in alignment with urgent care protocols and evidence based medicine.  Patient/parent/caregiver has been advised that the patient may require follow up for further testing and treatment if the symptoms continue in spite of treatment, as clinically indicated and appropriate.  Patient/parent/caregiver has been advised to  return to the [facility] or PCP if no better; to PCP or the Emergency Department if new signs and symptoms develop, or if the current signs or symptoms continue to change or worsen for further workup, evaluation and treatment as clinically indicated and appropriate  The patient will follow up with their current PCP if and as advised. If the patient does not currently have a PCP we will assist them in obtaining one.   The patient may need specialty follow up if the symptoms continue, in spite of conservative treatment and management, for further workup, evaluation, consultation and treatment as clinically indicated and appropriate.  Patient/parent/caregiver verbalized understanding and agreement of plan as discussed.  All questions were addressed during visit.  Please see discharge instructions below for further details of plan.  This office note has been dictated using Teaching laboratory technician.  Unfortunately, this method of dictation can sometimes lead to typographical or grammatical errors.  I apologize for your inconvenience in advance if this occurs.  Please do not hesitate to reach out to me if clarification is needed.      Eloise Hake Scales, PA-C 03/28/24 1858

## 2024-03-29 ENCOUNTER — Telehealth: Payer: Self-pay

## 2024-03-29 ENCOUNTER — Encounter: Payer: Self-pay | Admitting: Internal Medicine

## 2024-04-12 ENCOUNTER — Encounter: Payer: Self-pay | Admitting: Internal Medicine

## 2024-05-03 ENCOUNTER — Ambulatory Visit (INDEPENDENT_AMBULATORY_CARE_PROVIDER_SITE_OTHER): Payer: Self-pay | Admitting: Internal Medicine

## 2024-05-03 VITALS — BP 122/68 | HR 82 | Temp 98.3°F | Resp 16 | Ht 71.0 in | Wt 227.2 lb

## 2024-05-03 DIAGNOSIS — Z780 Asymptomatic menopausal state: Secondary | ICD-10-CM | POA: Diagnosis not present

## 2024-05-03 DIAGNOSIS — Z1231 Encounter for screening mammogram for malignant neoplasm of breast: Secondary | ICD-10-CM | POA: Diagnosis not present

## 2024-05-03 DIAGNOSIS — E010 Iodine-deficiency related diffuse (endemic) goiter: Secondary | ICD-10-CM | POA: Diagnosis not present

## 2024-05-03 DIAGNOSIS — Z01419 Encounter for gynecological examination (general) (routine) without abnormal findings: Secondary | ICD-10-CM

## 2024-05-03 DIAGNOSIS — Z0000 Encounter for general adult medical examination without abnormal findings: Secondary | ICD-10-CM | POA: Diagnosis not present

## 2024-05-03 MED ORDER — SERTRALINE HCL 100 MG PO TABS: 100.0000 mg | ORAL_TABLET | Freq: Every day | ORAL | 3 refills | Status: AC

## 2024-05-03 NOTE — Patient Instructions (Signed)
 We are referring you to the breast center to get your mammograms.  You can call them at [phone]  Please stop by the first floor and arrange thyroid  ultrasound  Is important you see the gynecologist      GO TO THE LAB :  Get the blood work   Your results will be posted on MyChart with my comments  Next office visit for a physical exam in 1 year Please make an appointment before you leave today

## 2024-05-03 NOTE — Progress Notes (Unsigned)
   Subjective:    Patient ID: Rita Lambert, female    DOB: May 31, 1966, 58 y.o.   MRN: [id number]  DOS:  05/03/2024 Type of visit - description: CPX  Here for CPX Since the last time I saw her she is doing well.   Review of Systems See above   Past Medical History:  Diagnosis Date   Allergy    Depression     Past Surgical History:  Procedure Laterality Date   BREAST BIOPSY Left ~  12/2019   bx neg    KNEE ARTHROSCOPY W/ MENISCECTOMY Left 03/10/2023   Pt states this was canceled   MOUTH SURGERY     teeth removal, freniolectomy   VAGINAL DELIVERY     2000, 2004, and 2010    Current Outpatient Medications  Medication Instructions   ELDERBERRY PO Take by mouth.   sertraline  (ZOLOFT ) 100 mg, Oral, Daily   Zinc Citrate (ZINC GUMMY) 11 MG CHEW Chew by mouth.       Objective:   Physical Exam BP 122/68   Pulse 82   Temp 98.3 F (36.8 C) (Oral)   Resp 16   Ht 5' 11 (1.803 m)   Wt 227 lb 4 oz (103.1 kg)   LMP 11/24/2017 (Approximate)   SpO2 96%   BMI 31.69 kg/m  General: Well developed, NAD, BMI noted Neck: No  thyromegaly  HEENT:  Normocephalic . Face symmetric, atraumatic Lungs:  CTA B Normal respiratory effort, no intercostal retractions, no accessory muscle use. Heart: RRR,  no murmur.  Abdomen:  Not distended, soft, non-tender. No rebound or rigidity.   Lower extremities: no pretibial edema bilaterally  Skin: Exposed areas without rash. Not pale. Not jaundice Neurologic:  alert & oriented X3.  Speech normal, gait appropriate for age and unassisted Strength symmetric and appropriate for age.  Psych: Cognition and judgment appear intact.  Cooperative with normal attention span and concentration.  Behavior appropriate. No anxious or depressed appearing.     Assessment     Assessment Depression Allergies  Menopausal LMP July 2022 Thyromegaly (per [place]  2021)  PLAN LOV 2023 Here for CPX -Td  03/2016 - s/p shingrix x2  -Vax I rec: covid  booster, flu shot  CCS: never had a cscope -Cologuard  05-2019 and 11/20203: both neg   -  Female Care:   Menopausal started 2022, has not seen gynecology in years, I encouraged her to see them.  Patient will think about it. - Breast cancer: +FH, pt is neg Braca , last MMG 07-2021.  Due to her family history she was supposed to be seen at the   high risk clinic / cancer center @ [facility] but she has not pursued that.  I stressed the importance of pursue regular mammograms, she eventually agreed to be referred to our breast center. - Labs: CMP FLP CBC A1c TSH vitamin D - Diet, exercise: Doing well since April, lost 14 pounds.  Present  Depression: On sertraline  Knee pain: On OTCs sporadically Thyromegaly: Per ultrasound 2021, exam today is benign, recommend thyroid  ultrasound (see report from 2021). RTC 1 year.

## 2024-05-04 ENCOUNTER — Encounter: Payer: Self-pay | Admitting: Internal Medicine

## 2024-05-04 LAB — CBC WITH DIFFERENTIAL/PLATELET
Basophils Absolute: 0 10*3/uL (ref 0.0–0.1)
Basophils Relative: 0.2 % (ref 0.0–3.0)
Eosinophils Absolute: 0.4 10*3/uL (ref 0.0–0.7)
Eosinophils Relative: 5.5 % — ABNORMAL HIGH (ref 0.0–5.0)
HCT: 37.3 % (ref 36.0–46.0)
Hemoglobin: 12.3 g/dL (ref 12.0–15.0)
Lymphocytes Relative: 20.3 % (ref 12.0–46.0)
Lymphs Abs: 1.5 10*3/uL (ref 0.7–4.0)
MCHC: 33 g/dL (ref 30.0–36.0)
MCV: 78 fl (ref 78.0–100.0)
Monocytes Absolute: 0.4 10*3/uL (ref 0.1–1.0)
Monocytes Relative: 4.9 % (ref 3.0–12.0)
Neutro Abs: 5 10*3/uL (ref 1.4–7.7)
Neutrophils Relative %: 69.1 % (ref 43.0–77.0)
Platelets: 241 10*3/uL (ref 150.0–400.0)
RBC: 4.79 Mil/uL (ref 3.87–5.11)
RDW: 16.5 % — ABNORMAL HIGH (ref 11.5–15.5)
WBC: 7.2 10*3/uL (ref 4.0–10.5)

## 2024-05-04 LAB — LIPID PANEL
Cholesterol: 170 mg/dL (ref 0–200)
HDL: 50.5 mg/dL (ref >39.00)
LDL Cholesterol: 99 mg/dL (ref 0–99)
NonHDL: 119.02
Total CHOL/HDL Ratio: 3
Triglycerides: 98 mg/dL (ref 0.0–149.0)
VLDL: 19.6 mg/dL (ref 0.0–40.0)

## 2024-05-04 LAB — COMPREHENSIVE METABOLIC PANEL WITH GFR
ALT: 9 U/L (ref 0–35)
AST: 16 U/L (ref 0–37)
Albumin: 4.5 g/dL (ref 3.5–5.2)
Alkaline Phosphatase: 78 U/L (ref 39–117)
BUN: 16 mg/dL (ref 6–23)
CO2: 24 meq/L (ref 19–32)
Calcium: 9.2 mg/dL (ref 8.4–10.5)
Chloride: 106 meq/L (ref 96–112)
Creatinine, Ser: 0.97 mg/dL (ref 0.40–1.20)
GFR: 65.21 mL/min (ref >60.00)
Glucose, Bld: 79 mg/dL (ref 70–99)
Potassium: 4.3 meq/L (ref 3.5–5.1)
Sodium: 140 meq/L (ref 135–145)
Total Bilirubin: 0.4 mg/dL (ref 0.2–1.2)
Total Protein: 6.7 g/dL (ref 6.0–8.3)

## 2024-05-04 LAB — VITAMIN D 25 HYDROXY (VIT D DEFICIENCY, FRACTURES): VITD: 20.11 ng/mL — ABNORMAL LOW (ref 30.00–100.00)

## 2024-05-04 LAB — TSH: TSH: 1.14 u[IU]/mL (ref 0.35–5.50)

## 2024-05-04 LAB — HEMOGLOBIN A1C: Hgb A1c MFr Bld: 6 % (ref 4.6–6.5)

## 2024-05-04 NOTE — Assessment & Plan Note (Signed)
 Here for CPX---LOV 2023 -Td  03/2016 - s/p shingrix x2  -Vax I rec: covid booster, flu shot  CCS: never had a cscope -Cologuard  05-2019 and 11/20203: both neg  -  Female Care:   Menopausal started 2022, has not seen gynecology in years, I encouraged her to see them.  Patient will think about it. - Breast cancer screening: +FH, pt reports she is BRCA (-), last MMG 07-2021.  Due to her family history she was supposed to be seen at the   high risk clinic / cancer center @ [facility] but she has not pursued that.  I stressed the importance of pursue regular mammograms, she eventually agreed to be referred to our breast center. - Labs: CMP FLP CBC A1c TSH vitamin D - Diet, exercise: Doing well since April, lost 14 pounds.  Present

## 2024-05-04 NOTE — Assessment & Plan Note (Signed)
 Here for CPX  Other issues addressed Depression: On sertraline , RF sent Knee pain: On OTCs sporadically Thyromegaly: Per ultrasound 2021, exam today is benign, recommend thyroid  ultrasound (see report from 2021), she reluctantly agreed.SABRA RTC 1 year.

## 2024-05-06 ENCOUNTER — Ambulatory Visit: Payer: Self-pay | Admitting: Internal Medicine

## 2024-05-06 MED ORDER — VITAMIN D (ERGOCALCIFEROL) 1.25 MG (50000 UNIT) PO CAPS: 50000.0000 [IU] | ORAL_CAPSULE | ORAL | 0 refills | Status: AC

## 2024-05-10 ENCOUNTER — Telehealth (HOSPITAL_BASED_OUTPATIENT_CLINIC_OR_DEPARTMENT_OTHER): Payer: Self-pay

## 2024-05-25 ENCOUNTER — Telehealth (HOSPITAL_BASED_OUTPATIENT_CLINIC_OR_DEPARTMENT_OTHER): Payer: Self-pay

## 2024-07-26 ENCOUNTER — Ambulatory Visit
Admission: RE | Admit: 2024-07-26 | Discharge: 2024-07-26 | Disposition: A | Discharge status: Home / Self Care | Source: Ambulatory Visit | Attending: Family Medicine | Admitting: Family Medicine

## 2024-07-26 ENCOUNTER — Other Ambulatory Visit: Payer: Self-pay | Admitting: Internal Medicine

## 2024-07-26 ENCOUNTER — Ambulatory Visit

## 2024-07-26 VITALS — BP 144/79 | HR 86 | Temp 98.1°F | Resp 19

## 2024-07-26 DIAGNOSIS — R0781 Pleurodynia: Secondary | ICD-10-CM | POA: Diagnosis not present

## 2024-07-26 DIAGNOSIS — R0789 Other chest pain: Secondary | ICD-10-CM | POA: Diagnosis not present

## 2024-07-26 DIAGNOSIS — S20211A Contusion of right front wall of thorax, initial encounter: Secondary | ICD-10-CM | POA: Diagnosis not present

## 2024-07-26 MED ORDER — CELECOXIB 200 MG PO CAPS
200.0000 mg | ORAL_CAPSULE | Freq: Every day | ORAL | 0 refills | Status: AC
Start: 2024-07-26 — End: 2024-08-10
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
## 2024-07-26 NOTE — Discharge Instructions (Addendum)
 Advised patient of chest x-ray results with hardcopy provided.  Advised patient take medication as directed with food to completion.  Encouraged to increase daily water intake to 64 ounces per day while taking this medication.  Advised if symptoms worsen and/or unresolved please follow-up with your PCP or here for further evaluation.
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
## 2024-09-12 ENCOUNTER — Other Ambulatory Visit: Payer: Self-pay | Admitting: Medical Genetics

## 2024-09-12 DIAGNOSIS — Z006 Encounter for examination for normal comparison and control in clinical research program: Secondary | ICD-10-CM
